# Patient Record
Sex: Male | Born: 1980
Health system: Southern US, Community
[De-identification: ages and names within clinical notes are randomized; demographics above are authoritative.]

## PROBLEM LIST (undated history)

## (undated) DIAGNOSIS — E663 Overweight: Secondary | ICD-10-CM

## (undated) DIAGNOSIS — K219 Gastro-esophageal reflux disease without esophagitis: Secondary | ICD-10-CM

## (undated) DIAGNOSIS — I499 Cardiac arrhythmia, unspecified: Secondary | ICD-10-CM

## (undated) DIAGNOSIS — G4733 Obstructive sleep apnea (adult) (pediatric): Secondary | ICD-10-CM

## (undated) DIAGNOSIS — L989 Disorder of the skin and subcutaneous tissue, unspecified: Secondary | ICD-10-CM

## (undated) DIAGNOSIS — Z800 Family history of malignant neoplasm of digestive organs: Secondary | ICD-10-CM

## (undated) DIAGNOSIS — E785 Hyperlipidemia, unspecified: Secondary | ICD-10-CM

## (undated) HISTORY — DX: Gastro-esophageal reflux disease without esophagitis: K21.9

## (undated) HISTORY — DX: Family history of malignant neoplasm of digestive organs: Z80.0

## (undated) HISTORY — DX: Hyperlipidemia, unspecified: E78.5

## (undated) HISTORY — PX: NO PAST SURGERIES: SHX2092

## (undated) HISTORY — DX: Disorder of the skin and subcutaneous tissue, unspecified: L98.9

## (undated) HISTORY — DX: Overweight: E66.3

## (undated) HISTORY — DX: Cardiac arrhythmia, unspecified: I49.9

## (undated) HISTORY — PX: TONSILLECTOMY: SUR1361

## (undated) HISTORY — DX: Morbid (severe) obesity due to excess calories: E66.01

## (undated) HISTORY — DX: Obstructive sleep apnea (adult) (pediatric): G47.33

---

## 2013-04-20 ENCOUNTER — Encounter: Payer: Self-pay | Admitting: Gastroenterology

## 2013-06-05 ENCOUNTER — Encounter: Payer: Self-pay | Admitting: *Deleted

## 2013-06-06 ENCOUNTER — Encounter: Payer: Self-pay | Admitting: Gastroenterology

## 2013-06-06 ENCOUNTER — Ambulatory Visit (INDEPENDENT_AMBULATORY_CARE_PROVIDER_SITE_OTHER): Payer: 59 | Admitting: Gastroenterology

## 2013-06-06 VITALS — BP 118/78 | HR 68 | Ht 73.0 in | Wt 251.0 lb

## 2013-06-06 DIAGNOSIS — Z800 Family history of malignant neoplasm of digestive organs: Secondary | ICD-10-CM

## 2013-06-06 DIAGNOSIS — K219 Gastro-esophageal reflux disease without esophagitis: Secondary | ICD-10-CM

## 2013-06-06 HISTORY — DX: Family history of malignant neoplasm of digestive organs: Z80.0

## 2013-06-06 NOTE — Progress Notes (Signed)
    _                                                                                                                History of Present Illness: Pleasant 33 year old white male referred for colorectal cancer screening.  His father developed colon cancer at 89 and died a year and a half later.  The patient has pyrosis which is well-controlled with Nexium.  Should he miss a dose he may develop a sour stomach and then frank pyrosis within 24 hours.  He denies dysphagia or abdominal pain.  There's been no change in bowel habits or rectal bleeding.    Past Medical History  Diagnosis Date  . Family hx of colon cancer     father  . GERD (gastroesophageal reflux disease)    History reviewed. No pertinent past surgical history. family history includes Colon cancer (age of onset: 75) in his father. Current Outpatient Prescriptions  Medication Sig Dispense Refill  . esomeprazole (NEXIUM) 40 MG capsule Take 40 mg by mouth daily at 12 noon.       No current facility-administered medications for this visit.   Allergies as of 06/06/2013  . (No Known Allergies)    reports that he has never smoked. He has never used smokeless tobacco. He reports that he does not drink alcohol or use illicit drugs.     Review of Systems: Pertinent positive and negative review of systems were noted in the above HPI section. All other review of systems were otherwise negative.  Vital signs were reviewed in today's medical record Physical Exam: General: Well developed , well nourished, no acute distress Skin: anicteric Head: Normocephalic and atraumatic Eyes:  sclerae anicteric, EOMI Ears: Normal auditory acuity Mouth: No deformity or lesions Neck: Supple, no masses or thyromegaly Lungs: Clear throughout to auscultation Heart: Regular rate and rhythm; no murmurs, rubs or bruits Abdomen: Soft, non tender and non distended. No masses, hepatosplenomegaly or hernias noted. Normal Bowel  sounds Rectal:deferred Musculoskeletal: Symmetrical with no gross deformities  Skin: No lesions on visible extremities Pulses:  Normal pulses noted Extremities: No clubbing, cyanosis, edema or deformities noted Neurological: Alert oriented x 4, grossly nonfocal Cervical Nodes:  No significant cervical adenopathy Inguinal Nodes: No significant inguinal adenopathy Psychological:  Alert and cooperative. Normal mood and affect  See Assessment and Plan under Problem List

## 2013-06-06 NOTE — Assessment & Plan Note (Signed)
Patient is asymptomatic.  I recommended beginning colorectal cancer screening at 7, sooner if he becomes anxious about this or if he develops any alarm symptoms including significant change in bowel habits or persistent rectal bleeding.

## 2013-06-06 NOTE — Patient Instructions (Signed)
Follow up as needed

## 2013-06-06 NOTE — Assessment & Plan Note (Signed)
Patient basically has PPI-dependent GERD.  He was advised to follow some antireflux measures and to try reducing Nexium to every other day, if possible.  Should he remain PPI-dependent then I would consider upper endoscopy at some point in the future to rule out Barrett's esophagus.

## 2018-11-08 DIAGNOSIS — R079 Chest pain, unspecified: Secondary | ICD-10-CM | POA: Diagnosis not present

## 2018-11-08 DIAGNOSIS — R002 Palpitations: Secondary | ICD-10-CM

## 2018-11-08 DIAGNOSIS — I4891 Unspecified atrial fibrillation: Secondary | ICD-10-CM | POA: Diagnosis not present

## 2018-11-08 DIAGNOSIS — R000 Tachycardia, unspecified: Secondary | ICD-10-CM

## 2018-11-08 DIAGNOSIS — E669 Obesity, unspecified: Secondary | ICD-10-CM

## 2018-11-09 DIAGNOSIS — I4891 Unspecified atrial fibrillation: Secondary | ICD-10-CM | POA: Diagnosis not present

## 2018-11-09 DIAGNOSIS — R002 Palpitations: Secondary | ICD-10-CM | POA: Diagnosis not present

## 2018-11-09 DIAGNOSIS — R000 Tachycardia, unspecified: Secondary | ICD-10-CM | POA: Diagnosis not present

## 2018-11-09 DIAGNOSIS — E669 Obesity, unspecified: Secondary | ICD-10-CM | POA: Diagnosis not present

## 2018-11-22 ENCOUNTER — Encounter: Payer: Self-pay | Admitting: Cardiology

## 2018-11-22 ENCOUNTER — Ambulatory Visit (INDEPENDENT_AMBULATORY_CARE_PROVIDER_SITE_OTHER): Payer: Commercial Managed Care - PPO | Admitting: Cardiology

## 2018-11-22 ENCOUNTER — Other Ambulatory Visit: Payer: Self-pay

## 2018-11-22 VITALS — BP 120/88 | HR 58 | Ht 73.0 in | Wt 273.0 lb

## 2018-11-22 DIAGNOSIS — I10 Essential (primary) hypertension: Secondary | ICD-10-CM | POA: Diagnosis not present

## 2018-11-22 DIAGNOSIS — R001 Bradycardia, unspecified: Secondary | ICD-10-CM

## 2018-11-22 DIAGNOSIS — I4891 Unspecified atrial fibrillation: Secondary | ICD-10-CM

## 2018-11-22 MED ORDER — METOPROLOL SUCCINATE ER 25 MG PO TB24: 12.5000 mg | ORAL_TABLET | Freq: Every day | ORAL | 3 refills | Status: DC

## 2018-11-22 NOTE — Patient Instructions (Addendum)
Medication Instructions:  Your physician has recommended you make the following change in your medication:   STOP aspirin  DECREASE metoprolol succinate (toprol-XL) 25 mg: Take 0.5 tablet (12.5 mg) daily   If you need a refill on your cardiac medications before your next appointment, please call your pharmacy.   Lab work: None  If you have labs (blood work) drawn today and your tests are completely normal, you will receive your results only by: [name] MyChart Message (if you have MyChart) OR . A paper copy in the mail If you have any lab test that is abnormal or we need to change your treatment, we will call you to review the results.  Testing/Procedures: You had an EKG today.   Follow-Up: At [facility], you and your health needs are our priority.  As part of our continuing mission to provide you with exceptional heart care, we have created designated Provider Care Teams.  These Care Teams include your primary Cardiologist (physician) and Advanced Practice Providers (APPs -  Physician Assistants and Nurse Practitioners) who all work together to provide you with the care you need, when you need it. You will need a follow up appointment in 3 months.  Please call our office 2 months in advance to schedule this appointment.

## 2018-11-22 NOTE — Progress Notes (Signed)
Cardiology Office Note:    Date:  11/22/2018   ID:  Alexander James, DOB 30-May-1980, MRN [id number]  PCP:  [name], MD  Cardiologist:  No primary care provider on file.  Electrophysiologist:  None   Referring MD: [name], MD   Chief Complaint  Patient presents with  . Atrial Fibrillation   History of Present Illness:    Alexander James is a 38 y.o. male with a hx of recently diagnosed atrial fibrillation, obesity, smokes but not every day he states, GERD who presents today for establishment of care.  The patient was admitted at the [facility] on September 29 after he presented with palpitations.  While being worked up in the ED the patient was found to be atrial fibrillation with rapid ventricular rate.  He was hypertensive at the time with a systolic blood pressure of 143/105 and was then started on Cardizem drip.  On rate control agents the patient converted to sinus rhythm.   He did have an echocardiogram while admitted which showed an LVEF of 50 to 55%.  With no valvular abnormalities.  Of note the patient tells me that this was his birthday weekend and he spend the weekend on the horse from riding and he had significant alcohol intake (he did show he drink more than 2 cases of beer).  Since his hospitalization he has been doing well he has not experienced any lightheadedness, dizziness, or any palpitations.  Past Medical History:  Diagnosis Date  . Family hx of colon cancer    father  . GERD (gastroesophageal reflux disease)     History reviewed. No pertinent surgical history.  Current Medications: Current Meds  Medication Sig  . diltiazem (CARDIZEM) 120 MG tablet Take 120 mg by mouth daily.  [name] esomeprazole (NEXIUM) 40 MG capsule Take 40 mg by mouth daily at 12 noon.  . metoprolol succinate (TOPROL-XL) 25 MG 24 hr tablet Take 0.5 tablets (12.5 mg total) by mouth daily.  . [DISCONTINUED] aspirin EC 81 MG tablet Take 81 mg by mouth daily.   . [DISCONTINUED] metoprolol succinate (TOPROL-XL) 25 MG 24 hr tablet Take 25 mg by mouth daily.     Allergies:   Patient has no known allergies.   Social History   Socioeconomic History  . Marital status: Married    Spouse name: Not on file  . Number of children: 2  . Years of education: Not on file  . Highest education level: Not on file  Occupational History  . Occupation: Scientist, research (physical sciences): [employer]  Social Needs  . Financial resource strain: Not on file  . Food insecurity    Worry: Not on file    Inability: Not on file  . Transportation needs    Medical: Not on file    Non-medical: Not on file  Tobacco Use  . Smoking status: Never Smoker  . Smokeless tobacco: Never Used  Substance and Sexual Activity  . Alcohol use: No  . Drug use: No  . Sexual activity: Not on file  Lifestyle  . Physical activity    Days per week: Not on file    Minutes per session: Not on file  . Stress: Not on file  Relationships  . Social Herbalist on phone: Not on file    Gets together: Not on file    Attends religious service: Not on file    Active member of club or organization: Not  on file    Attends meetings of clubs or organizations: Not on file    Relationship status: Not on file  Other Topics Concern  . Not on file  Social History Narrative  . Not on file     Family History: The patient's family history includes Colon cancer (age of onset: 66) in his father.  ROS:   Review of Systems  Constitution: Negative for decreased appetite, fever and weight gain.  HENT: Negative for congestion, ear discharge, hoarse voice and sore throat.   Eyes: Negative for discharge, redness, vision loss in right eye and visual halos.  Cardiovascular: Negative for chest pain, dyspnea on exertion, leg swelling, orthopnea and palpitations.  Respiratory: Negative for cough, hemoptysis, shortness of breath and snoring.   Endocrine: Negative for heat intolerance and  polyphagia.  Hematologic/Lymphatic: Negative for bleeding problem. Does not bruise/bleed easily.  Skin: Negative for flushing, nail changes, rash and suspicious lesions.  Musculoskeletal: Negative for arthritis, joint pain, muscle cramps, myalgias, neck pain and stiffness.  Gastrointestinal: Negative for abdominal pain, bowel incontinence, diarrhea and excessive appetite.  Genitourinary: Negative for decreased libido, genital sores and incomplete emptying.  Neurological: Negative for brief paralysis, focal weakness, headaches and loss of balance.  Psychiatric/Behavioral: Negative for altered mental status, depression and suicidal ideas.  Allergic/Immunologic: Negative for HIV exposure and persistent infections.    EKGs/Labs/Other Studies Reviewed:    The following studies were reviewed today:  EKG:  The ekg ordered today demonstrates sinus bradycardia, heart rate 58 bpm with nonspecific interventricular conduction defect.  Compared to his EKG done in [place] reports sinus rhythm heart rate 73 bpm.  No change.  Chest x-ray performed on November 08, 2018 reports no active disease Transthoracic echocardiogram performed on November 08, 2018 Patient was in atrial fibrillation.  Overall low normal left ventricular function visually estimated 50 to 55%.  The right ventricle is mildly enlarged.  The right ventricle had normal systolic function.  Left atrium was normal size.  The right atrium was mildly enlarged.  No valvular abnormalities. No pericardial effusion.  recent Labs: No results found for requested labs within last 8760 hours.  Recent Lipid Panel No results found for: CHOL, TRIG, HDL, CHOLHDL, VLDL, LDLCALC, LDLDIRECT  Physical Exam:    VS:  BP 120/88 (BP Location: Right Arm, Patient Position: Sitting, Cuff Size: Normal)   Pulse (!) 58   Ht 6\' 1"  (1.854 m)   Wt 273 lb (123.8 kg)   SpO2 98%   BMI 36.02 kg/m     Wt Readings from Last 3 Encounters:  11/22/18 273 lb (123.8 kg)   06/06/13 251 lb (113.9 kg)    GEN: Obese male, well nourished, well developed in no acute distress HEENT: Normal NECK: No JVD; No carotid bruits LYMPHATICS: No lymphadenopathy CARDIAC: S1S2 noted,RRR, no murmurs, rubs, gallops RESPIRATORY:  Clear to auscultation without rales, wheezing or rhonchi  ABDOMEN: Soft, non-tender, non-distended, +bowel sounds, no guarding. EXTREMITIES: No edema, No cyanosis, no clubbing MUSCULOSKELETAL:  No edema; No deformity  SKIN: Warm and dry NEUROLOGIC:  Alert and oriented x 3, non-focal PSYCHIATRIC:  Normal affect, good insight  ASSESSMENT:    1. Atrial fibrillation, unspecified type ([place])   2. Morbid obesity ([place])   3. Essential hypertension    PLAN:    1.  Patient with recently diagnosed atrial fibrillation which I do suspect holiday heart may have been playing a role.  Thankfully today the patient is in sinus rhythm, but his heart rate is 58 bpm.  Therefore at this time I will like to decrease his metoprolol succinate from 25 mg daily to 12.5 mg daily.  He remain on his diltiazem 120 mg daily.  In addition patient chads vas score is questionably.  He has been placed on aspirin 81 mg daily, for now we will stop the Aspirin 81 mg daily as the risk of bleeding especially in his line of with his line of work outweighs the minimum benefit of the aspirin 81 mg.  We will continue to reassess his need for anticoagulation for stroke prevention.  I explained this to patient and he is in agreement with this.   2.  With his new diagnosis of atrial fibrillation risk modification is also heavily weighted.  He is planning a sleep study with his PCP to rule out sleep apnea and if diagnosed will be treated with a CPAP.    3.  The patient understands the need to lose weight with diet and exercise. We have discussed specific strategies for this.  He also is working on decreasing alcohol intake especially on the weekends.  4.  His blood pressure is acceptable today we  will continue to monitor.  5. COVID-19 positive test (U07.1, COVID-19) with Acute Respiratory Distress Syndrome (ARDS) (J80, ARDS) (If respiratory failure or sepsis present, add as separate assessment)  Cholesterol panel and hemoglobin A1c has been reviewed.  His most recent hemoglobin A1c was 5.7.  TSH performed on September 29 was 1.44.  The patient is in agreement with the above plan. The patient left the office in stable condition.  The patient will follow up in 3 months or sooner if needed.  Medication Adjustments/Labs and Tests Ordered: Current medicines are reviewed at length with the patient today.  Concerns regarding medicines are outlined above.  Orders Placed This Encounter  Procedures  . EKG 12-Lead   Meds ordered this encounter  Medications  . metoprolol succinate (TOPROL-XL) 25 MG 24 hr tablet    Sig: Take 0.5 tablets (12.5 mg total) by mouth daily.    Dispense:  15 tablet    Refill:  3    Patient Instructions  Medication Instructions:  Your physician has recommended you make the following change in your medication:   STOP aspirin  DECREASE metoprolol succinate (toprol-XL) 25 mg: Take 0.5 tablet (12.5 mg) daily   If you need a refill on your cardiac medications before your next appointment, please call your pharmacy.   Lab work: None  If you have labs (blood work) drawn today and your tests are completely normal, you will receive your results only by: [name] MyChart Message (if you have MyChart) OR . A paper copy in the mail If you have any lab test that is abnormal or we need to change your treatment, we will call you to review the results.  Testing/Procedures: You had an EKG today.   Follow-Up: At [facility], you and your health needs are our priority.  As part of our continuing mission to provide you with exceptional heart care, we have created designated Provider Care Teams.  These Care Teams include your primary Cardiologist (physician) and Advanced  Practice Providers (APPs -  Physician Assistants and Nurse Practitioners) who all work together to provide you with the care you need, when you need it. You will need a follow up appointment in 3 months.  Please call our office 2 months in advance to schedule this appointment.     Reduce your risk of stroke if you have atrial fibrillation  Atrial fibrillation can lead to blood clots and stroke. Find out how to manage your risk. By [facility] Staff  Atrial fibrillation, a common heart rhythm disorder, can increase your risk of stroke. In atrial fibrillation, blood can pool in the heart's upper chambers and form blood clots. If a blood clot forms in the left-sided upper chamber (left atrium), it could break free from your heart and travel to your brain. A blood clot can block blood flow to your brain and cause a stroke. Blood clots can also block blood flow to other organs. The risk of stroke from atrial fibrillation rises as you grow older. The following health conditions also increase your risk: . High blood pressure  . Diabetes  . Heart failure  . Some valvular heart disease  . Stroke To reduce your risk of stroke or damage to other organs caused by blood clots, your doctor may prescribe a blood-thinning medication (anticoagulant), such as: . Apixaban (Eliquis)  . Dabigatran (Pradaxa)  . Edoxaban (Savaysa)  . Rivaroxaban (Xarelto)  . Warfarin (Coumadin, Jantoven)  Sometimes, if you can't take blood-thinning medications, your doctor may recommend a procedure called left atrial appendage closure to seal a small sac (appendage) in your left atrium, where most clots form in people with atrial fibrillation. A closure device is gently guided through a catheter to the appendage. Once the device is in place, the catheter is removed. The device is left permanently in place. Surgery to close the left atrial appendage is an option for some people already having heart surgery. Living a healthy lifestyle  can improve your heart health and reduce your risk of developing atrial fibrillation. Aim to include healthy habits in your life such as: [name] Exercising regularly  . Eating heart-healthy foods  . Keeping a healthy weight  . Avoiding smoking  . Limiting or avoiding alcohol  . Keeping blood pressure and cholesterol levels under control Managing your atrial fibrillation and any other conditions you have that increase your risk of stroke also can help you reduce your risk of stroke.Adopting a Healthy Lifestyle.  Know what a healthy weight is for you (roughly BMI <25) and aim to maintain this   Aim for 7+ servings of fruits and vegetables daily   65-80+ fluid ounces of water or unsweet tea for healthy kidneys   Limit to max 1 drink of alcohol per day; avoid smoking/tobacco   Limit animal fats in diet for cholesterol and heart health - choose grass fed whenever available   Avoid highly processed foods, and foods high in saturated/trans fats   Aim for low stress - take time to unwind and care for your mental health   Aim for 150 min of moderate intensity exercise weekly for heart health, and weights twice weekly for bone health   Aim for 7-9 hours of sleep daily   When it comes to diets, agreement about the perfect plan isnt easy to find, even among the experts. Experts at the [place] developed an idea known as the Healthy Eating Plate. Just imagine a plate divided into logical, healthy portions.   The emphasis is on diet quality:   Load up on vegetables and fruits - one-half of your plate: Aim for color and variety, and remember that potatoes dont count.   Go for whole grains - one-quarter of your plate: Whole wheat, barley, wheat berries, quinoa, oats, brown rice, and foods made with them. If you want pasta, go with whole wheat pasta.  Protein power - one-quarter of your plate: Fish, chicken, beans, and nuts are all healthy, versatile protein sources. Limit red  meat.   The diet, however, does go beyond the plate, offering a few other suggestions.   Use healthy plant oils, such as olive, canola, soy, corn, sunflower and peanut. Check the labels, and avoid partially hydrogenated oil, which have unhealthy trans fats.   If youre thirsty, drink water. Coffee and tea are good in moderation, but skip sugary drinks and limit milk and dairy products to one or two daily servings.   The type of carbohydrate in the diet is more important than the amount. Some sources of carbohydrates, such as vegetables, fruits, whole grains, and beans-are healthier than others.   Finally, stay active  Signed, Berniece Salines, DO  11/22/2018 9:36 AM    [place]

## 2019-03-03 ENCOUNTER — Other Ambulatory Visit (INDEPENDENT_AMBULATORY_CARE_PROVIDER_SITE_OTHER): Payer: Commercial Managed Care - PPO

## 2019-03-03 ENCOUNTER — Encounter: Payer: Self-pay | Admitting: Cardiology

## 2019-03-03 ENCOUNTER — Other Ambulatory Visit: Payer: Self-pay

## 2019-03-03 ENCOUNTER — Ambulatory Visit (INDEPENDENT_AMBULATORY_CARE_PROVIDER_SITE_OTHER): Payer: Commercial Managed Care - PPO | Admitting: Cardiology

## 2019-03-03 VITALS — BP 144/82 | HR 79 | Ht 73.0 in | Wt 280.0 lb

## 2019-03-03 DIAGNOSIS — I480 Paroxysmal atrial fibrillation: Secondary | ICD-10-CM | POA: Diagnosis not present

## 2019-03-03 NOTE — Addendum Note (Signed)
Addended by: Ronaldo Miyamoto on: 03/03/2019 09:17 AM   Modules accepted: Orders

## 2019-03-03 NOTE — Progress Notes (Signed)
Cardiology Office Note:    Date:  03/03/2019   ID:  Alexander James, DOB Oct 03, 1980, MRN [id number]  PCP:  [name], MD  Cardiologist:  Berniece Salines, DO  Electrophysiologist:  None   Referring MD: [name], MD   Follow-up for atrial fibrillation  History of Present Illness:    Alexander James is a 39 y.o. male with a hx of atrial fibrillation, GERD is here today for a follow up visit. He reports that at time he thinks for few seconds he is in atrial fibrillation.  He described as quick palpitations.  He denies any chest pain, shortness of breath, nausea, vomiting he tells me does not last long enough for him to be able to go to the ED.  No other complaints at this time.  Past Medical History:  Diagnosis Date  . Family hx of colon cancer    father  . GERD (gastroesophageal reflux disease)     History reviewed. No pertinent surgical history.  Current Medications: Current Meds  Medication Sig  . diltiazem (CARDIZEM) 120 MG tablet Take 120 mg by mouth daily.  [name] esomeprazole (NEXIUM) 40 MG capsule Take 40 mg by mouth daily at 12 noon.  . metoprolol succinate (TOPROL-XL) 25 MG 24 hr tablet Take 0.5 tablets (12.5 mg total) by mouth daily.     Allergies:   Patient has no known allergies.   Social History   Socioeconomic History  . Marital status: Married    Spouse name: Not on file  . Number of children: 2  . Years of education: Not on file  . Highest education level: Not on file  Occupational History  . Occupation: Scientist, research (physical sciences): [employer]  Tobacco Use  . Smoking status: Never Smoker  . Smokeless tobacco: Never Used  Substance and Sexual Activity  . Alcohol use: No  . Drug use: No  . Sexual activity: Not on file  Other Topics Concern  . Not on file  Social History Narrative  . Not on file   Social Determinants of Health   Financial Resource Strain:   . Difficulty of Paying Living Expenses: Not on file  Food Insecurity:    . Worried About Charity fundraiser in the Last Year: Not on file  . Ran Out of Food in the Last Year: Not on file  Transportation Needs:   . Lack of Transportation (Medical): Not on file  . Lack of Transportation (Non-Medical): Not on file  Physical Activity:   . Days of Exercise per Week: Not on file  . Minutes of Exercise per Session: Not on file  Stress:   . Feeling of Stress : Not on file  Social Connections:   . Frequency of Communication with Friends and Family: Not on file  . Frequency of Social Gatherings with Friends and Family: Not on file  . Attends Religious Services: Not on file  . Active Member of Clubs or Organizations: Not on file  . Attends Archivist Meetings: Not on file  . Marital Status: Not on file     Family History: The patient's family history includes Colon cancer (age of onset: 45) in his father.  ROS:   Review of Systems  Constitution: Negative for decreased appetite, fever and weight gain.  HENT: Negative for congestion, ear discharge, hoarse voice and sore throat.   Eyes: Negative for discharge, redness, vision loss in right eye and visual halos.  Cardiovascular: Negative  for chest pain, dyspnea on exertion, leg swelling, orthopnea and palpitations.  Respiratory: Negative for cough, hemoptysis, shortness of breath and snoring.   Endocrine: Negative for heat intolerance and polyphagia.  Hematologic/Lymphatic: Negative for bleeding problem. Does not bruise/bleed easily.  Skin: Negative for flushing, nail changes, rash and suspicious lesions.  Musculoskeletal: Negative for arthritis, joint pain, muscle cramps, myalgias, neck pain and stiffness.  Gastrointestinal: Negative for abdominal pain, bowel incontinence, diarrhea and excessive appetite.  Genitourinary: Negative for decreased libido, genital sores and incomplete emptying.  Neurological: Negative for brief paralysis, focal weakness, headaches and loss of balance.    Psychiatric/Behavioral: Negative for altered mental status, depression and suicidal ideas.  Allergic/Immunologic: Negative for HIV exposure and persistent infections.    EKGs/Labs/Other Studies Reviewed:    The following studies were reviewed today:   EKG:  The ekg ordered today demonstrates sinus rhythm, heart rate 70 bpm compared to prior EKG patient no longer sinus bradycardia.   Chest x-ray performed on November 08, 2018 reports no active disease Transthoracic echocardiogram performed on November 08, 2018 Patient was in atrial fibrillation.  Overall low normal left ventricular function visually estimated 50 to 55%.  The right ventricle is mildly enlarged.  The right ventricle had normal systolic function.  Left atrium was normal size.  The right atrium was mildly enlarged.  No valvular abnormalities. No pericardial effusion.  Recent Labs: No results found for requested labs within last 8760 hours.  Recent Lipid Panel No results found for: CHOL, TRIG, HDL, CHOLHDL, VLDL, LDLCALC, LDLDIRECT  Physical Exam:    VS:  BP (!) 144/82   Pulse 79   Ht 6\' 1"  (1.854 m)   Wt 280 lb (127 kg)   SpO2 93%   BMI 36.94 kg/m     Wt Readings from Last 3 Encounters:  03/03/19 280 lb (127 kg)  11/22/18 273 lb (123.8 kg)  06/06/13 251 lb (113.9 kg)     GEN: Well nourished, well developed in no acute distress HEENT: Normal NECK: No JVD; No carotid bruits LYMPHATICS: No lymphadenopathy CARDIAC: S1S2 noted,RRR, no murmurs, rubs, gallops RESPIRATORY:  Clear to auscultation without rales, wheezing or rhonchi  ABDOMEN: Soft, non-tender, non-distended, +bowel sounds, no guarding. EXTREMITIES: No edema, No cyanosis, no clubbing MUSCULOSKELETAL:  No edema; No deformity  SKIN: Warm and dry NEUROLOGIC:  Alert and oriented x 3, non-focal PSYCHIATRIC:  Normal affect, good insight  ASSESSMENT:    1. Paroxysmal A-fib (HCC)    PLAN:    1.  He is in sinus rhythm today.  The patient suspect  that he is going in and out of atrial fibrillation.  His first episode was suspected to be likely all of the heart giving the circumstances that he had had heavy drinking for his birthday prior to getting into atrial fibrillation.  However given the concern that he is having palpitations I am going to place a ZIO monitor on the patient for 14 days to assess for any heart rate excursions.  His CHA2DS2-VASc score remains to be 1 for hypertension-therefore no indication for anticoagulation.  We will continue to reassess the need for anticoagulation.  If he is having burst of atrial fibrillation he will be a good candidate for flecainide 50 mg twice daily.  2.  He is hypertensive in the office today 144/82 mmHg.  He meets the eating high salt food in the last few days.  I have advised him to decrease his salt intake.  The patient is in agreement with the  above plan. The patient left the office in stable condition.  The patient will follow up in 3 months or sooner if needed.   Medication Adjustments/Labs and Tests Ordered: Current medicines are reviewed at length with the patient today.  Concerns regarding medicines are outlined above.  Orders Placed This Encounter  Procedures  . LONG TERM MONITOR (3-14 DAYS)   No orders of the defined types were placed in this encounter.   Patient Instructions  Medication Instructions:  Your physician recommends that you continue on your current medications as directed. Please refer to the Current Medication list given to you today.  *If you need a refill on your cardiac medications before your next appointment, please call your pharmacy*  Lab Work: None ordered   If you have labs (blood work) drawn today and your tests are completely normal, you will receive your results only by: [name] MyChart Message (if you have MyChart) OR . A paper copy in the mail If you have any lab test that is abnormal or we need to change your treatment, we will call you to review the  results.  Testing/Procedures: A zio monitor was ordered today. It will remain on for 14 days. You will then return monitor and event diary in provided box. It takes 1-2 weeks for report to be downloaded and returned to [place]. We will call you with the results. If monitor falls off or has orange flashing light, please call Zio for further instructions.     Follow-Up: At [facility], you and your health needs are our priority.  As part of our continuing mission to provide you with exceptional heart care, we have created designated Provider Care Teams.  These Care Teams include your primary Cardiologist (physician) and Advanced Practice Providers (APPs -  Physician Assistants and Nurse Practitioners) who all work together to provide you with the care you need, when you need it.  Your next appointment:   3 month(s)  The format for your next appointment:   In Person  Provider:   Shirlee More, MD  Other Instructions None      Adopting a Healthy Lifestyle.  Know what a healthy weight is for you (roughly BMI <25) and aim to maintain this   Aim for 7+ servings of fruits and vegetables daily   65-80+ fluid ounces of water or unsweet tea for healthy kidneys   Limit to max 1 drink of alcohol per day; avoid smoking/tobacco   Limit animal fats in diet for cholesterol and heart health - choose grass fed whenever available   Avoid highly processed foods, and foods high in saturated/trans fats   Aim for low stress - take time to unwind and care for your mental health   Aim for 150 min of moderate intensity exercise weekly for heart health, and weights twice weekly for bone health   Aim for 7-9 hours of sleep daily   When it comes to diets, agreement about the perfect plan isnt easy to find, even among the experts. Experts at the [place] developed an idea known as the Healthy Eating Plate. Just imagine a plate divided into logical, healthy portions.   The  emphasis is on diet quality:   Load up on vegetables and fruits - one-half of your plate: Aim for color and variety, and remember that potatoes dont count.   Go for whole grains - one-quarter of your plate: Whole wheat, barley, wheat berries, quinoa, oats, brown rice, and foods made with them.  If you want pasta, go with whole wheat pasta.   Protein power - one-quarter of your plate: Fish, chicken, beans, and nuts are all healthy, versatile protein sources. Limit red meat.   The diet, however, does go beyond the plate, offering a few other suggestions.   Use healthy plant oils, such as olive, canola, soy, corn, sunflower and peanut. Check the labels, and avoid partially hydrogenated oil, which have unhealthy trans fats.   If youre thirsty, drink water. Coffee and tea are good in moderation, but skip sugary drinks and limit milk and dairy products to one or two daily servings.   The type of carbohydrate in the diet is more important than the amount. Some sources of carbohydrates, such as vegetables, fruits, whole grains, and beans-are healthier than others.   Finally, stay active  Signed, Berniece Salines, DO  03/03/2019 8:55 AM    [place]

## 2019-03-03 NOTE — Patient Instructions (Signed)
Medication Instructions:  Your physician recommends that you continue on your current medications as directed. Please refer to the Current Medication list given to you today.  *If you need a refill on your cardiac medications before your next appointment, please call your pharmacy*  Lab Work: None ordered   If you have labs (blood work) drawn today and your tests are completely normal, you will receive your results only by: [name] MyChart Message (if you have MyChart) OR . A paper copy in the mail If you have any lab test that is abnormal or we need to change your treatment, we will call you to review the results.  Testing/Procedures: A zio monitor was ordered today. It will remain on for 14 days. You will then return monitor and event diary in provided box. It takes 1-2 weeks for report to be downloaded and returned to [place]. We will call you with the results. If monitor falls off or has orange flashing light, please call Zio for further instructions.     Follow-Up: At [facility], you and your health needs are our priority.  As part of our continuing mission to provide you with exceptional heart care, we have created designated Provider Care Teams.  These Care Teams include your primary Cardiologist (physician) and Advanced Practice Providers (APPs -  Physician Assistants and Nurse Practitioners) who all work together to provide you with the care you need, when you need it.  Your next appointment:   3 month(s)  The format for your next appointment:   In Person  Provider:   Shirlee More, MD  Other Instructions None

## 2019-03-06 NOTE — Addendum Note (Signed)
Addended by: Particia Nearing B on: 03/06/2019 09:42 AM   Modules accepted: Orders

## 2019-03-30 ENCOUNTER — Telehealth: Payer: Self-pay | Admitting: *Deleted

## 2019-03-30 NOTE — Telephone Encounter (Signed)
-----   Message from Berniece Salines, DO sent at 03/29/2019 11:17 PM EST ----- Please see if the patient can see me in the next 2 weeks to discuss his monitor results.

## 2019-03-30 NOTE — Telephone Encounter (Signed)
Patient informed and verbalized understanding. Copy sent to PCP 

## 2019-04-18 ENCOUNTER — Other Ambulatory Visit: Payer: Self-pay | Admitting: Cardiology

## 2019-04-24 ENCOUNTER — Encounter: Payer: Self-pay | Admitting: Cardiology

## 2019-04-24 ENCOUNTER — Ambulatory Visit: Payer: Commercial Managed Care - PPO | Admitting: Cardiology

## 2019-04-24 ENCOUNTER — Other Ambulatory Visit: Payer: Self-pay

## 2019-04-24 VITALS — BP 140/86 | HR 82 | Ht 73.0 in | Wt 279.0 lb

## 2019-04-24 DIAGNOSIS — I480 Paroxysmal atrial fibrillation: Secondary | ICD-10-CM

## 2019-04-24 DIAGNOSIS — E669 Obesity, unspecified: Secondary | ICD-10-CM

## 2019-04-24 DIAGNOSIS — I10 Essential (primary) hypertension: Secondary | ICD-10-CM | POA: Diagnosis not present

## 2019-04-24 NOTE — Patient Instructions (Addendum)
Medication Instructions:  Your physician recommends that you continue on your current medications as directed. Please refer to the Current Medication list given to you today.  *If you need a refill on your cardiac medications before your next appointment, please call your pharmacy*   Lab Work: None If you have labs (blood work) drawn today and your tests are completely normal, you will receive your results only by: [name] MyChart Message (if you have MyChart) OR . A paper copy in the mail If you have any lab test that is abnormal or we need to change your treatment, we will call you to review the results.   Testing/Procedures: none   Follow-Up: At [facility], you and your health needs are our priority.  As part of our continuing mission to provide you with exceptional heart care, we have created designated Provider Care Teams.  These Care Teams include your primary Cardiologist (physician) and Advanced Practice Providers (APPs -  Physician Assistants and Nurse Practitioners) who all work together to provide you with the care you need, when you need it.  We recommend signing up for the patient portal called "MyChart".  Sign up information is provided on this After Visit Summary.  MyChart is used to connect with patients for Virtual Visits (Telemedicine).  Patients are able to view lab/test results, encounter notes, upcoming appointments, etc.  Non-urgent messages can be sent to your provider as well.   To learn more about what you can do with MyChart, go to [email or web address].    Your next appointment:   3 month(s)  The format for your next appointment:   In Person  Provider:   Berniece Salines, DO   Other Instructions: [email or web address] [email or web address]        FDA-cleared, clinical grade mobile EKG monitor: Jodelle Red is the most clinically-validated mobile EKG used by the world's leading cardiac care medical professionals With Basic service, know  instantly if your heart rhythm is normal or if atrial fibrillation is detected, and email the last single EKG recording to yourself or your doctor Premium service, available for purchase through the Kardia app for $9.99 per month or $99 per year, includes unlimited history and storage of your EKG recordings, a monthly EKG summary report to share with your doctor, along with the ability to track your blood pressure, activity and weight Includes one KardiaMobile phone clip FREE SHIPPING: Standard delivery 1-3 business days. Orders placed by 11:00am PST will ship that afternoon. Otherwise, will ship next business day. All orders ship via [employer] from [place], [place]

## 2019-04-24 NOTE — Progress Notes (Signed)
Cardiology Office Note:    Date:  04/24/2019   ID:  Alexander James, DOB 1981-01-12, MRN [id number]  PCP:  [name], MD  Cardiologist:  Berniece Salines, DO  Electrophysiologist:  None   Referring MD: [name], MD   Chief Complaint  Patient presents with  . Follow-up   History of Present Illness:    Alexander James is a 39 y.o. male with a hx of atrial fibrillation, GERD is here today for a follow up visit. He tells me that he has felt better since his last visit.  He offers no other complaints at this time.   Past Medical History:  Diagnosis Date  . Family hx of colon cancer    father  . GERD (gastroesophageal reflux disease)     No past surgical history on file.  Current Medications: Current Meds  Medication Sig  . diltiazem (CARDIZEM) 120 MG tablet Take 120 mg by mouth daily.  [name] esomeprazole (NEXIUM) 40 MG capsule Take 40 mg by mouth daily at 12 noon.  . metoprolol succinate (TOPROL-XL) 25 MG 24 hr tablet TAKE 1/2 TABLET EVERY DAY     Allergies:   Patient has no known allergies.   Social History   Socioeconomic History  . Marital status: Married    Spouse name: Not on file  . Number of children: 2  . Years of education: Not on file  . Highest education level: Not on file  Occupational History  . Occupation: Scientist, research (physical sciences): [employer]  Tobacco Use  . Smoking status: Never Smoker  . Smokeless tobacco: Never Used  Substance and Sexual Activity  . Alcohol use: No  . Drug use: No  . Sexual activity: Not on file  Other Topics Concern  . Not on file  Social History Narrative  . Not on file   Social Determinants of Health   Financial Resource Strain:   . Difficulty of Paying Living Expenses:   Food Insecurity:   . Worried About Charity fundraiser in the Last Year:   . Arboriculturist in the Last Year:   Transportation Needs:   . Film/video editor (Medical):   [name] Lack of Transportation (Non-Medical):     Physical Activity:   . Days of Exercise per Week:   . Minutes of Exercise per Session:   Stress:   . Feeling of Stress :   Social Connections:   . Frequency of Communication with Friends and Family:   . Frequency of Social Gatherings with Friends and Family:   . Attends Religious Services:   . Active Member of Clubs or Organizations:   . Attends Archivist Meetings:   [name] Marital Status:      Family History: The patient's family history includes Colon cancer (age of onset: 25) in his father.  ROS:   Review of Systems  Constitution: Negative for decreased appetite, fever and weight gain.  HENT: Negative for congestion, ear discharge, hoarse voice and sore throat.   Eyes: Negative for discharge, redness, vision loss in right eye and visual halos.  Cardiovascular: Negative for chest pain, dyspnea on exertion, leg swelling, orthopnea and palpitations.  Respiratory: Negative for cough, hemoptysis, shortness of breath and snoring.   Endocrine: Negative for heat intolerance and polyphagia.  Hematologic/Lymphatic: Negative for bleeding problem. Does not bruise/bleed easily.  Skin: Negative for flushing, nail changes, rash and suspicious lesions.  Musculoskeletal: Negative for arthritis, joint pain, muscle  cramps, myalgias, neck pain and stiffness.  Gastrointestinal: Negative for abdominal pain, bowel incontinence, diarrhea and excessive appetite.  Genitourinary: Negative for decreased libido, genital sores and incomplete emptying.  Neurological: Negative for brief paralysis, focal weakness, headaches and loss of balance.  Psychiatric/Behavioral: Negative for altered mental status, depression and suicidal ideas.  Allergic/Immunologic: Negative for HIV exposure and persistent infections.    EKGs/Labs/Other Studies Reviewed:    The following studies were reviewed today:   EKG:  The ekg ordered today demonstrates sinus rhythm, 66 bpm.  Prior EKG no significant change.  Zio  monitor  The patient wore the monitor for 7 days 11 hours starting March 03, 2019. Indication: Paroxysmal atrial fibrillation.  The minimum heart rate was 48 bpm, maximum heart rate was 156 bpm, and average heart rate was 78 bpm. Predominant underlying rhythm was Sinus Rhythm.  First-degree AV block was present.  2 Supraventricular Tachycardia (which appeared to be short runs of atrial fibrillation)  occurred, the run with the fastest interval lasting 5 beats with a max rate of 156 bpm, the longest lasting 8 beats with an avg rate of 121 bpm.  Premature atrial complexes were rare (less than 1%). Premature Ventricular complexes were rare (less than 1%). No ventricular tachycardia, no pauses, No second or third-degree AV block .  2 patient triggered events noted 1 associated with paroxysmal SVT and the other with sinus rhythm.  Conclusion: This study is symptomatic paroxysmal SVT with appears to be short runs of atrial fibrillation.  Recent Labs: No results found for requested labs within last 8760 hours.  Recent Lipid Panel No results found for: CHOL, TRIG, HDL, CHOLHDL, VLDL, LDLCALC, LDLDIRECT  Physical Exam:    VS:  BP 140/86 (BP Location: Left Arm, Patient Position: Sitting, Cuff Size: Large)   Pulse 82   Ht 6\' 1"  (1.854 m)   Wt 279 lb (126.6 kg)   SpO2 98%   BMI 36.81 kg/m     Wt Readings from Last 3 Encounters:  04/24/19 279 lb (126.6 kg)  03/03/19 280 lb (127 kg)  11/22/18 273 lb (123.8 kg)     GEN: Well nourished, well developed in no acute distress HEENT: Normal NECK: No JVD; No carotid bruits LYMPHATICS: No lymphadenopathy CARDIAC: S1S2 noted,RRR, no murmurs, rubs, gallops RESPIRATORY:  Clear to auscultation without rales, wheezing or rhonchi  ABDOMEN: Soft, non-tender, non-distended, +bowel sounds, no guarding. EXTREMITIES: No edema, No cyanosis, no clubbing MUSCULOSKELETAL:  No deformity  SKIN: Warm and dry NEUROLOGIC:  Alert and oriented x 3,  non-focal PSYCHIATRIC:  Normal affect, good insight  ASSESSMENT:    1. Paroxysmal A-fib ([place])   2. Essential hypertension   3. Obesity (BMI 30-39.9)    PLAN:     He is on Cardizem 120 mg daily and Toprol Xl 12.5 mg daily. He tells me that he has felt better. I discussed his monitor results with him. I preferred to start antiarrhythmics-flecainide 50 mg the patient prefers to hold off for now since he feels better - he is in SR today.  So I asked him to monitor his rate and rhythm at home, I did advise him to get a Evalee Mutton which will help with home monitoring. At his next visit we will review this and further recommendations will be made.   HTN - discussed with patient to decrease salt intake in his diet- keep same regimen for now.  Obesity -the patient understands the need to lose weight with diet and exercise. We have discussed  specific strategies for this.  The patient is in agreement with the above plan. The patient left the office in stable condition.  The patient will follow up in 3 months.   Medication Adjustments/Labs and Tests Ordered: Current medicines are reviewed at length with the patient today.  Concerns regarding medicines are outlined above.  Orders Placed This Encounter  Procedures  . EKG 12-Lead   No orders of the defined types were placed in this encounter.   Patient Instructions  Medication Instructions:  Your physician recommends that you continue on your current medications as directed. Please refer to the Current Medication list given to you today.  *If you need a refill on your cardiac medications before your next appointment, please call your pharmacy*   Lab Work: None If you have labs (blood work) drawn today and your tests are completely normal, you will receive your results only by: [name] MyChart Message (if you have MyChart) OR . A paper copy in the mail If you have any lab test that is abnormal or we need to change your treatment, we will call  you to review the results.   Testing/Procedures: none   Follow-Up: At [facility], you and your health needs are our priority.  As part of our continuing mission to provide you with exceptional heart care, we have created designated Provider Care Teams.  These Care Teams include your primary Cardiologist (physician) and Advanced Practice Providers (APPs -  Physician Assistants and Nurse Practitioners) who all work together to provide you with the care you need, when you need it.  We recommend signing up for the patient portal called "MyChart".  Sign up information is provided on this After Visit Summary.  MyChart is used to connect with patients for Virtual Visits (Telemedicine).  Patients are able to view lab/test results, encounter notes, upcoming appointments, etc.  Non-urgent messages can be sent to your provider as well.   To learn more about what you can do with MyChart, go to [email or web address].    Your next appointment:   3 month(s)  The format for your next appointment:   In Person  Provider:   Berniece Salines, DO   Other Instructions: [email or web address] [email or web address]        FDA-cleared, clinical grade mobile EKG monitor: Jodelle Red is the most clinically-validated mobile EKG used by the world's leading cardiac care medical professionals With Basic service, know instantly if your heart rhythm is normal or if atrial fibrillation is detected, and email the last single EKG recording to yourself or your doctor Premium service, available for purchase through the Kardia app for $9.99 per month or $99 per year, includes unlimited history and storage of your EKG recordings, a monthly EKG summary report to share with your doctor, along with the ability to track your blood pressure, activity and weight Includes one KardiaMobile phone clip FREE SHIPPING: Standard delivery 1-3 business days. Orders placed by 11:00am PST will ship that afternoon. Otherwise,  will ship next business day. All orders ship via [employer] from [place], [place]         Adopting a Healthy Lifestyle.  Know what a healthy weight is for you (roughly BMI <25) and aim to maintain this   Aim for 7+ servings of fruits and vegetables daily   65-80+ fluid ounces of water or unsweet tea for healthy kidneys   Limit to max 1 drink of alcohol per day; avoid smoking/tobacco   Limit animal fats in diet for cholesterol and heart health -  choose grass fed whenever available   Avoid highly processed foods, and foods high in saturated/trans fats   Aim for low stress - take time to unwind and care for your mental health   Aim for 150 min of moderate intensity exercise weekly for heart health, and weights twice weekly for bone health   Aim for 7-9 hours of sleep daily   When it comes to diets, agreement about the perfect plan isnt easy to find, even among the experts. Experts at the [employer] developed an idea known as the Healthy Eating Plate. Just imagine a plate divided into logical, healthy portions.   The emphasis is on diet quality:   Load up on vegetables and fruits - one-half of your plate: Aim for color and variety, and remember that potatoes dont count.   Go for whole grains - one-quarter of your plate: Whole wheat, barley, wheat berries, quinoa, oats, brown rice, and foods made with them. If you want pasta, go with whole wheat pasta.   Protein power - one-quarter of your plate: Fish, chicken, beans, and nuts are all healthy, versatile protein sources. Limit red meat.   The diet, however, does go beyond the plate, offering a few other suggestions.   Use healthy plant oils, such as olive, canola, soy, corn, sunflower and peanut. Check the labels, and avoid partially hydrogenated oil, which have unhealthy trans fats.   If youre thirsty, drink water. Coffee and tea are good in moderation, but skip sugary drinks and limit milk and dairy  products to one or two daily servings.   The type of carbohydrate in the diet is more important than the amount. Some sources of carbohydrates, such as vegetables, fruits, whole grains, and beans-are healthier than others.   Finally, stay active  Signed, Berniece Salines, DO  04/24/2019 9:23 AM    [facility]

## 2019-05-31 ENCOUNTER — Ambulatory Visit: Payer: Commercial Managed Care - PPO | Admitting: Cardiology

## 2019-06-02 ENCOUNTER — Ambulatory Visit: Payer: Commercial Managed Care - PPO | Admitting: Cardiology

## 2019-06-06 ENCOUNTER — Ambulatory Visit: Payer: Commercial Managed Care - PPO | Admitting: Cardiology

## 2019-06-09 ENCOUNTER — Encounter: Payer: Self-pay | Admitting: Cardiology

## 2019-06-09 ENCOUNTER — Other Ambulatory Visit: Payer: Self-pay

## 2019-06-09 ENCOUNTER — Ambulatory Visit: Payer: Commercial Managed Care - PPO | Admitting: Cardiology

## 2019-06-09 VITALS — BP 138/90 | HR 70 | Ht 72.0 in | Wt 269.0 lb

## 2019-06-09 DIAGNOSIS — I480 Paroxysmal atrial fibrillation: Secondary | ICD-10-CM | POA: Insufficient documentation

## 2019-06-09 DIAGNOSIS — I10 Essential (primary) hypertension: Secondary | ICD-10-CM

## 2019-06-09 DIAGNOSIS — E669 Obesity, unspecified: Secondary | ICD-10-CM

## 2019-06-09 HISTORY — DX: Essential (primary) hypertension: I10

## 2019-06-09 HISTORY — DX: Paroxysmal atrial fibrillation: I48.0

## 2019-06-09 MED ORDER — FLECAINIDE ACETATE 50 MG PO TABS: 50.0000 mg | ORAL_TABLET | Freq: Two times a day (BID) | ORAL | 1 refills | Status: DC

## 2019-06-09 MED ORDER — DILTIAZEM HCL ER COATED BEADS 240 MG PO CP24: 240.0000 mg | ORAL_CAPSULE | Freq: Every day | ORAL | 1 refills | Status: DC

## 2019-06-09 NOTE — Patient Instructions (Signed)
Medication Instructions:  Your physician has recommended you make the following change in your medication:   STOP:Metoprolol   START: Flecainide 50 mg twice daily   INCREASE: Cardizem to 240 mg daily   *If you need a refill on your cardiac medications before your next appointment, please call your pharmacy*   Lab Work: None.  If you have labs (blood work) drawn today and your tests are completely normal, you will receive your results only by: [name] MyChart Message (if you have MyChart) OR . A paper copy in the mail If you have any lab test that is abnormal or we need to change your treatment, we will call you to review the results.   Testing/Procedures: None.    Follow-Up: At [facility], you and your health needs are our priority.  As part of our continuing mission to provide you with exceptional heart care, we have created designated Provider Care Teams.  These Care Teams include your primary Cardiologist (physician) and Advanced Practice Providers (APPs -  Physician Assistants and Nurse Practitioners) who all work together to provide you with the care you need, when you need it.  We recommend signing up for the patient portal called "MyChart".  Sign up information is provided on this After Visit Summary.  MyChart is used to connect with patients for Virtual Visits (Telemedicine).  Patients are able to view lab/test results, encounter notes, upcoming appointments, etc.  Non-urgent messages can be sent to your provider as well.   To learn more about what you can do with MyChart, go to [email or web address].    Your next appointment:   3 month(s)  The format for your next appointment:   In Person  Provider:   Berniece Salines, DO   Other Instructions  You will need a ekg visit next Friday.

## 2019-06-09 NOTE — Progress Notes (Signed)
Cardiology Office Note:    Date:  06/09/2019   ID:  Alexander James, DOB December 10, 1980, MRN [id number]  PCP:  [name], MD  Cardiologist:  Berniece Salines, DO  Electrophysiologist:  None   Referring MD: [name], MD   Chief Complaint  Patient presents with  . Follow-up    3 Months   History of Present Illness:    Alexander James is a 39 y.o. male with a hx of paroxysmal atrial fibrillation, GERD, hypertension is here today for follow-up visit.  I last saw him on April 24, 2019 at that time we discussed his ZIO monitor which show some runs of paroxysmal atrial fibrillation.  We discussed the start of flecainide at that time the patient prefers to hold off.  The patient is here for follow-up visit.  He tells me that he has had episodes where he felt that he was definitely in atrial fibrillation.  He has iPad watch which he notes the heart rate change in the 100s when he feels like he is out of rhythm.  He notes that it has been particularly stressful for him recently.  No other complaints at this time.  He denies shortness of breath, chest pain  Past Medical History:  Diagnosis Date  . Esophageal reflux 06/06/2013  . Family history of malignant neoplasm of gastrointestinal tract 06/06/2013   Father with colon cancer age 64   . Family hx of colon cancer    father  . GERD (gastroesophageal reflux disease)     Past Surgical History:  Procedure Laterality Date  . NO PAST SURGERIES      Current Medications: Current Meds  Medication Sig  . esomeprazole (NEXIUM) 40 MG capsule Take 40 mg by mouth daily at 12 noon.  . [DISCONTINUED] diltiazem (CARDIZEM) 120 MG tablet Take 120 mg by mouth daily.  . [DISCONTINUED] metoprolol succinate (TOPROL-XL) 25 MG 24 hr tablet TAKE 1/2 TABLET EVERY DAY     Allergies:   Patient has no known allergies.   Social History   Socioeconomic History  . Marital status: Married    Spouse name: Not on file  . Number of children: 2  .  Years of education: Not on file  . Highest education level: Not on file  Occupational History  . Occupation: Scientist, research (physical sciences): [employer]  Tobacco Use  . Smoking status: Never Smoker  . Smokeless tobacco: Never Used  Substance and Sexual Activity  . Alcohol use: No  . Drug use: No  . Sexual activity: Not on file  Other Topics Concern  . Not on file  Social History Narrative  . Not on file   Social Determinants of Health   Financial Resource Strain:   . Difficulty of Paying Living Expenses:   Food Insecurity:   . Worried About Charity fundraiser in the Last Year:   . Arboriculturist in the Last Year:   Transportation Needs:   . Film/video editor (Medical):   [name] Lack of Transportation (Non-Medical):   Physical Activity:   . Days of Exercise per Week:   . Minutes of Exercise per Session:   Stress:   . Feeling of Stress :   Social Connections:   . Frequency of Communication with Friends and Family:   . Frequency of Social Gatherings with Friends and Family:   . Attends Religious Services:   . Active Member of Clubs or Organizations:   .  Attends Archivist Meetings:   [name] Marital Status:      Family History: The patient's family history includes Colon cancer (age of onset: 93) in his father.  ROS:   Review of Systems  Constitution: Negative for decreased appetite, fever and weight gain.  HENT: Negative for congestion, ear discharge, hoarse voice and sore throat.   Eyes: Negative for discharge, redness, vision loss in right eye and visual halos.  Cardiovascular: Negative for chest pain, dyspnea on exertion, leg swelling, orthopnea and palpitations.  Respiratory: Negative for cough, hemoptysis, shortness of breath and snoring.   Endocrine: Negative for heat intolerance and polyphagia.  Hematologic/Lymphatic: Negative for bleeding problem. Does not bruise/bleed easily.  Skin: Negative for flushing, nail changes, rash and suspicious lesions.    Musculoskeletal: Negative for arthritis, joint pain, muscle cramps, myalgias, neck pain and stiffness.  Gastrointestinal: Negative for abdominal pain, bowel incontinence, diarrhea and excessive appetite.  Genitourinary: Negative for decreased libido, genital sores and incomplete emptying.  Neurological: Negative for brief paralysis, focal weakness, headaches and loss of balance.  Psychiatric/Behavioral: Negative for altered mental status, depression and suicidal ideas.  Allergic/Immunologic: Negative for HIV exposure and persistent infections.    EKGs/Labs/Other Studies Reviewed:    The following studies were reviewed today:   EKG:  The ekg ordered today demonstrates sinus rhythm, heart rate 70 bpm  Zio monitor  The patient wore the monitor for 7 days 11 hours starting March 03, 2019. Indication: Paroxysmal atrial fibrillation.  The minimum heart rate was 48 bpm, maximum heart rate was 156 bpm, and average heart rate was 78 bpm. Predominant underlying rhythm was Sinus Rhythm. First-degree AV block was present.  2 Supraventricular Tachycardia (which appeared to be short runs of atrial fibrillation) occurred, the run with the fastest interval lasting 5 beats with a max rate of 156 bpm, the longest lasting 8 beats with an avg rate of 121 bpm.  Premature atrial complexes were rare (less than 1%). Premature Ventricular complexes were rare (less than 1%). No ventricular tachycardia, no pauses, No second or third-degree AV block .  2 patient triggered events noted 1 associated with paroxysmal SVT and the other with sinus rhythm.  Conclusion: This study is symptomaticparoxysmal SVT with appears to be short runs of atrial fibrillation.  Recent Labs: No results found for requested labs within last 8760 hours.  Recent Lipid Panel No results found for: CHOL, TRIG, HDL, CHOLHDL, VLDL, LDLCALC, LDLDIRECT  Physical Exam:    VS:  BP 138/90   Pulse 70   Ht 6' (1.829 m)   Wt 269  lb (122 kg)   SpO2 96%   BMI 36.48 kg/m     Wt Readings from Last 3 Encounters:  06/09/19 269 lb (122 kg)  04/24/19 279 lb (126.6 kg)  03/03/19 280 lb (127 kg)     GEN: Well nourished, well developed in no acute distress HEENT: Normal NECK: No JVD; No carotid bruits LYMPHATICS: No lymphadenopathy CARDIAC: S1S2 noted,RRR, no murmurs, rubs, gallops RESPIRATORY:  Clear to auscultation without rales, wheezing or rhonchi  ABDOMEN: Soft, non-tender, non-distended, +bowel sounds, no guarding. EXTREMITIES: No edema, No cyanosis, no clubbing MUSCULOSKELETAL:  No deformity  SKIN: Warm and dry NEUROLOGIC:  Alert and oriented x 3, non-focal PSYCHIATRIC:  Normal affect, good insight  ASSESSMENT:    1. Paroxysmal A-fib ([place])   2. Essential hypertension   3. Obesity (BMI 30-39.9)    PLAN:     He has experiencing intermittent bursts of atrial fibrillation since  his last visit.  We discussed strongly antiarrhythmics today I am going to start him on flecainide 50 mg twice a day.  With this I will stop the beta-blocker and continue him on his calcium channel blocker.  Due to elevated blood pressure I am going to increase calcium channel blocker to to 40 mg daily.  He will follow up in 1 week for EKG to assess his QT.  QTc 408 ms in the office today.  PR interval 192 ms.  Hypertension-blood pressure 138/90 mmHg.  He states that his blood pressure has been higher given he has significant stress recently.  Hopefully with the increase in Cardizem this will help improve this.  Obesity the patient understands the need to lose weight with diet and exercise. We have discussed specific strategies for this.  The patient is in agreement with the above plan. The patient left the office in stable condition.  The patient will follow up in 1 week for EKG follow-up.   Medication Adjustments/Labs and Tests Ordered: Current medicines are reviewed at length with the patient today.  Concerns regarding medicines  are outlined above.  Orders Placed This Encounter  Procedures  . EKG 12-Lead   Meds ordered this encounter  Medications  . diltiazem (CARDIZEM CD) 240 MG 24 hr capsule    Sig: Take 1 capsule (240 mg total) by mouth daily.    Dispense:  90 capsule    Refill:  1  . flecainide (TAMBOCOR) 50 MG tablet    Sig: Take 1 tablet (50 mg total) by mouth 2 (two) times daily.    Dispense:  180 tablet    Refill:  1    Patient Instructions  Medication Instructions:  Your physician has recommended you make the following change in your medication:   STOP:Metoprolol   START: Flecainide 50 mg twice daily   INCREASE: Cardizem to 240 mg daily   *If you need a refill on your cardiac medications before your next appointment, please call your pharmacy*   Lab Work: None.  If you have labs (blood work) drawn today and your tests are completely normal, you will receive your results only by: [name] MyChart Message (if you have MyChart) OR . A paper copy in the mail If you have any lab test that is abnormal or we need to change your treatment, we will call you to review the results.   Testing/Procedures: None.    Follow-Up: At [facility], you and your health needs are our priority.  As part of our continuing mission to provide you with exceptional heart care, we have created designated Provider Care Teams.  These Care Teams include your primary Cardiologist (physician) and Advanced Practice Providers (APPs -  Physician Assistants and Nurse Practitioners) who all work together to provide you with the care you need, when you need it.  We recommend signing up for the patient portal called "MyChart".  Sign up information is provided on this After Visit Summary.  MyChart is used to connect with patients for Virtual Visits (Telemedicine).  Patients are able to view lab/test results, encounter notes, upcoming appointments, etc.  Non-urgent messages can be sent to your provider as well.   To learn more  about what you can do with MyChart, go to [email or web address].    Your next appointment:   3 month(s)  The format for your next appointment:   In Person  Provider:   Berniece Salines, DO   Other Instructions  You will need a ekg visit next  Friday.      Adopting a Healthy Lifestyle.  Know what a healthy weight is for you (roughly BMI <25) and aim to maintain this   Aim for 7+ servings of fruits and vegetables daily   65-80+ fluid ounces of water or unsweet tea for healthy kidneys   Limit to max 1 drink of alcohol per day; avoid smoking/tobacco   Limit animal fats in diet for cholesterol and heart health - choose grass fed whenever available   Avoid highly processed foods, and foods high in saturated/trans fats   Aim for low stress - take time to unwind and care for your mental health   Aim for 150 min of moderate intensity exercise weekly for heart health, and weights twice weekly for bone health   Aim for 7-9 hours of sleep daily   When it comes to diets, agreement about the perfect plan isnt easy to find, even among the experts. Experts at the [employer] developed an idea known as the Healthy Eating Plate. Just imagine a plate divided into logical, healthy portions.   The emphasis is on diet quality:   Load up on vegetables and fruits - one-half of your plate: Aim for color and variety, and remember that potatoes dont count.   Go for whole grains - one-quarter of your plate: Whole wheat, barley, wheat berries, quinoa, oats, brown rice, and foods made with them. If you want pasta, go with whole wheat pasta.   Protein power - one-quarter of your plate: Fish, chicken, beans, and nuts are all healthy, versatile protein sources. Limit red meat.   The diet, however, does go beyond the plate, offering a few other suggestions.   Use healthy plant oils, such as olive, canola, soy, corn, sunflower and peanut. Check the labels, and avoid partially  hydrogenated oil, which have unhealthy trans fats.   If youre thirsty, drink water. Coffee and tea are good in moderation, but skip sugary drinks and limit milk and dairy products to one or two daily servings.   The type of carbohydrate in the diet is more important than the amount. Some sources of carbohydrates, such as vegetables, fruits, whole grains, and beans-are healthier than others.   Finally, stay active  Signed, Berniece Salines, DO  06/09/2019 10:05 AM    [place]

## 2019-06-15 NOTE — Progress Notes (Signed)
Cardiology Office Note:    Date:  06/16/2019   ID:  Alexander James, DOB 1980/06/29, MRN [id number]  PCP:  [name], MD  Cardiologist:  Dr Harriet Masson  Referring MD: [name], MD    ASSESSMENT:    1. Paroxysmal A-fib ([place])   2. High risk medication use   3. Essential hypertension    PLAN:    In order of problems listed above:  For now continue antiarrhythmic therapy he is in sinus rhythm he is uncomfortable long-term would like to see EP alternative pulmonary vein isolation and I will refer him to Dr. Thompson Grayer Continue flecainide for now At target continue current treatment calcium channel blocker   Next appointment: As needed in the future after EP evaluation and treatment   Medication Adjustments/Labs and Tests Ordered: Current medicines are reviewed at length with the patient today.  Concerns regarding medicines are outlined above.  Orders Placed This Encounter  Procedures  . Ambulatory referral to Cardiac Electrophysiology  . EKG 12-Lead   No orders of the defined types were placed in this encounter.   Chief Complaint  Patient presents with  . Atrial Fibrillation    Started on flecainide    History of Present Illness:    Alexander James is a 39 y.o. male with a hx of  paroxysmal atrial fibrillation, GERD, hypertension last seen 06/09/2019 and initiated on flecainide. Compliance with diet, lifestyle and medications: Yes  He tolerates flecainide without side effect reports he has had no further episodes of atrial fibrillation.  Uses moderate alcohol and I gave him recommendations.  He would like to explore the option of pulmonary vein isolation and I told him in a young individual like him is at least equal and likely more effective than lifelong antiarrhythmic therapy he is worried about side effects he is worried about reentering healthcare system him admission to the hospital with atrial fibrillation and he is uncomfortable with his long-term  prognosis.  He request I will refer him for EP consultation for pulmonary vein isolation for atrial fibrillation.  He is not anticoagulated.  His CHADS2 score is relatively low at 1 with hypertension. Past Medical History:  Diagnosis Date  . Esophageal reflux 06/06/2013  . Essential hypertension 06/09/2019  . Family history of malignant neoplasm of gastrointestinal tract 06/06/2013   Father with colon cancer age 66   . Family hx of colon cancer    father  . GERD (gastroesophageal reflux disease)   . Paroxysmal A-fib ([place]) 06/09/2019    Past Surgical History:  Procedure Laterality Date  . NO PAST SURGERIES      Current Medications: Current Meds  Medication Sig  . diltiazem (CARDIZEM CD) 240 MG 24 hr capsule Take 1 capsule (240 mg total) by mouth daily.  [name] esomeprazole (NEXIUM) 40 MG capsule Take 40 mg by mouth daily at 12 noon.  . flecainide (TAMBOCOR) 50 MG tablet Take 1 tablet (50 mg total) by mouth 2 (two) times daily.     Allergies:   Patient has no known allergies.   Social History   Socioeconomic History  . Marital status: Married    Spouse name: Not on file  . Number of children: 2  . Years of education: Not on file  . Highest education level: Not on file  Occupational History  . 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 Smoking status: Never Smoker  . Smokeless tobacco: Never Used  Substance and Sexual Activity  . Alcohol use: No  . Drug use: No  . Sexual activity: Not on file  Other Topics Concern  . Not on file  Social History Narrative  . Not on file   Social Determinants of Health   Financial Resource Strain:   . Difficulty of Paying Living Expenses:   Food Insecurity:   . Worried About Charity fundraiser in the Last Year:   . Arboriculturist in the Last Year:   Transportation Needs:   . Film/video editor (Medical):   [name] Lack of Transportation (Non-Medical):   Physical Activity:   . Days of Exercise per Week:     . Minutes of Exercise per Session:   Stress:   . Feeling of Stress :   Social Connections:   . Frequency of Communication with Friends and Family:   . Frequency of Social Gatherings with Friends and Family:   . Attends Religious Services:   . Active Member of Clubs or Organizations:   . Attends Archivist Meetings:   [name] Marital Status:      Family History: The patient's family history includes Colon cancer (age of onset: 36) in his father. ROS:   Please see the history of present illness.    All other systems reviewed and are negative.  EKGs/Labs/Other Studies Reviewed:    The following studies were reviewed today:  EKG:  EKG ordered today and personally reviewed.  The ekg ordered today demonstrates sinus rhythm and is normal no evidence of A1c toxicity  Recent Labs: 03/24/2017 cholesterol 159 HDL 48 LDL 106  Physical Exam:    VS:  BP 126/78   Pulse 70   Temp 98.3 F (36.8 C)   Ht 6\' 1"  (1.854 m)   Wt 271 lb 6.4 oz (123.1 kg)   SpO2 96%   BMI 35.81 kg/m     Wt Readings from Last 3 Encounters:  06/16/19 271 lb 6.4 oz (123.1 kg)  06/09/19 269 lb (122 kg)  04/24/19 279 lb (126.6 kg)     GEN:  Well nourished, well developed in no acute distress HEENT: Normal NECK: No JVD; No carotid bruits LYMPHATICS: No lymphadenopathy CARDIAC: RRR, no murmurs, rubs, gallops RESPIRATORY:  Clear to auscultation without rales, wheezing or rhonchi  ABDOMEN: Soft, non-tender, non-distended MUSCULOSKELETAL:  No edema; No deformity  SKIN: Warm and dry NEUROLOGIC:  Alert and oriented x 3 PSYCHIATRIC:  Normal affect    Signed, Shirlee More, MD  06/16/2019 9:08 AM    Alexander James

## 2019-06-16 ENCOUNTER — Ambulatory Visit: Payer: Commercial Managed Care - PPO | Admitting: Cardiology

## 2019-06-16 ENCOUNTER — Other Ambulatory Visit: Payer: Self-pay

## 2019-06-16 ENCOUNTER — Encounter: Payer: Self-pay | Admitting: Cardiology

## 2019-06-16 VITALS — BP 126/78 | HR 70 | Temp 98.3°F | Ht 73.0 in | Wt 271.4 lb

## 2019-06-16 DIAGNOSIS — Z79899 Other long term (current) drug therapy: Secondary | ICD-10-CM

## 2019-06-16 DIAGNOSIS — I480 Paroxysmal atrial fibrillation: Secondary | ICD-10-CM

## 2019-06-16 DIAGNOSIS — I10 Essential (primary) hypertension: Secondary | ICD-10-CM | POA: Diagnosis not present

## 2019-06-16 NOTE — Patient Instructions (Signed)
Medication Instructions:  Your physician recommends that you continue on your current medications as directed. Please refer to the Current Medication list given to you today.  *If you need a refill on your cardiac medications before your next appointment, please call your pharmacy*   Lab Work: None If you have labs (blood work) drawn today and your tests are completely normal, you will receive your results only by: [name] MyChart Message (if you have MyChart) OR . A paper copy in the mail If you have any lab test that is abnormal or we need to change your treatment, we will call you to review the results.   Testing/Procedures: None   Follow-Up: At [facility], you and your health needs are our priority.  As part of our continuing mission to provide you with exceptional heart care, we have created designated Provider Care Teams.  These Care Teams include your primary Cardiologist (physician) and Advanced Practice Providers (APPs -  Physician Assistants and Nurse Practitioners) who all work together to provide you with the care you need, when you need it.  We recommend signing up for the patient portal called "MyChart".  Sign up information is provided on this After Visit Summary.  MyChart is used to connect with patients for Virtual Visits (Telemedicine).  Patients are able to view lab/test results, encounter notes, upcoming appointments, etc.  Non-urgent messages can be sent to your provider as well.   To learn more about what you can do with MyChart, go to [email or web address].    Your next appointment:   PRN Follow up  The format for your next appointment:   In Person  Provider:   Shirlee More, MD   Other Instructions We have put in a referral for you to see Dr. Thompson Grayer an electrophysiology doctor. They will call you to schedule this appointment.

## 2019-07-03 ENCOUNTER — Other Ambulatory Visit: Payer: Self-pay

## 2019-07-03 ENCOUNTER — Telehealth (INDEPENDENT_AMBULATORY_CARE_PROVIDER_SITE_OTHER): Payer: Commercial Managed Care - PPO | Admitting: Internal Medicine

## 2019-07-03 ENCOUNTER — Encounter: Payer: Self-pay | Admitting: Internal Medicine

## 2019-07-03 ENCOUNTER — Telehealth: Payer: Self-pay

## 2019-07-03 VITALS — Ht 73.0 in | Wt 260.0 lb

## 2019-07-03 DIAGNOSIS — I480 Paroxysmal atrial fibrillation: Secondary | ICD-10-CM | POA: Diagnosis not present

## 2019-07-03 DIAGNOSIS — I10 Essential (primary) hypertension: Secondary | ICD-10-CM

## 2019-07-03 DIAGNOSIS — E669 Obesity, unspecified: Secondary | ICD-10-CM | POA: Diagnosis not present

## 2019-07-03 DIAGNOSIS — I4891 Unspecified atrial fibrillation: Secondary | ICD-10-CM

## 2019-07-03 NOTE — Telephone Encounter (Signed)
LMTCB

## 2019-07-03 NOTE — Progress Notes (Signed)
Electrophysiology TeleHealth Note   Due to national recommendations of social distancing due to [place], Audio telehealth visit is felt to be most appropriate for this patient at this time.  See MyChart message from today for patient consent regarding telehealth for [facility].   Date:  07/03/2019   ID:  Alexander James, DOB 1980-03-03, MRN [id number]  Location: home  Provider location: [place] Evaluation Performed: New patient consult  PCP:  [name], MD  Cardiologist:  Dr Harriet Masson Electrophysiologist:  None   Chief Complaint:  afib  History of Present Illness:    Alexander James is a 39 y.o. male who presents via audio/video conferencing for a telehealth visit today.   The patient is referred for new consultation regarding afib by Dr Bettina Gavia.  He reports initially being diagnosed with atrial fibrillation 11/07/2018 (at his birthday).  He reports developed tachypalpitations after riding horses and drinking beer.  He had symptoms of fatigue and SOB. He presented to [facility] and documented to have afib.  He spontaneously converted to sinus rhythm.  He was evaluated by Dr Harriet Masson and started on metoprolol.  Lifestyle modification was advised.  He was placed on flecainide subsequently. He is doing well at this time.  Today, he denies symptoms of palpitations, chest pain, shortness of breath, orthopnea, PND, lower extremity edema, claudication, dizziness, presyncope, syncope, bleeding, or neurologic sequela. The patient is tolerating medications without difficulties and is otherwise without complaint today.     Past Medical History:  Diagnosis Date  . Essential hypertension 06/09/2019  . Family history of malignant neoplasm of gastrointestinal tract 06/06/2013   Father with colon cancer age 60   . Family hx of colon cancer    father  . GERD (gastroesophageal reflux disease)   . Overweight   . Paroxysmal A-fib ([place]) 06/09/2019    Past Surgical History:    Procedure Laterality Date  . NO PAST SURGERIES      Current Outpatient Medications  Medication Sig Dispense Refill  . diltiazem (CARDIZEM CD) 240 MG 24 hr capsule Take 1 capsule (240 mg total) by mouth daily. 90 capsule 1  . esomeprazole (NEXIUM) 40 MG capsule Take 40 mg by mouth daily at 12 noon.    . flecainide (TAMBOCOR) 50 MG tablet Take 1 tablet (50 mg total) by mouth 2 (two) times daily. 180 tablet 1   No current facility-administered medications for this visit.    Allergies:   Patient has no known allergies.   Social History:  The patient  reports that he has never smoked. He has never used smokeless tobacco. He reports current alcohol use. He reports that he does not use drugs.   Family History:  The patient's family history includes Colon cancer (age of onset: 78) in his father.    ROS:  Please see the history of present illness.   All other systems are personally reviewed and negative.    Exam:    Vital Signs:  Ht 6\' 1"  (1.854 m)   Wt 260 lb (117.9 kg)   BMI 34.30 kg/m    Well souding, alert and conversant    Labs/Other Tests and Data Reviewed:    Recent Labs: No results found for requested labs within last 8760 hours.   Wt Readings from Last 3 Encounters:  07/03/19 260 lb (117.9 kg)  06/16/19 271 lb 6.4 oz (123.1 kg)  06/09/19 269 lb (122 kg)     Other studies personally reviewed: Additional  studies/ records that were reviewed today include: Dr Quintella Reichert notes,  Dr Oren Binet notes,  Prior echo  Review of the above records today demonstrates: as above   ASSESSMENT & PLAN:    1.  Paroxysmal atrial fibrillation The patient has symptomatic, recurrent paroxysmal atrial fibrillation. he is doing reasonably well with flecainide but worries about long term effects Chads2vasc score is 1.  Guidelines do not require long term anticoagulation. Therapeutic strategies for afib including continuing medicine and ablation were discussed in detail with the patient today.  Risk, benefits, and alternatives to EP study and radiofrequency ablation for afib were also discussed in detail today. These risks include but are not limited to stroke, bleeding, vascular damage, tamponade, perforation, damage to the esophagus, lungs, and other structures, pulmonary vein stenosis, worsening renal function, and death. The patient understands these risk and wishes to proceed.  We will therefore proceed with catheter ablation at the next available time.  Carto, ICE, anesthesia are requested for the procedure.  Will also obtain cardiac CT prior to the procedure to exclude LAA thrombus and further evaluate atrial anatomy. Start xarelto 20mg  daily x 3 weeks prior to ablation.  2. Obesity Body mass index is 34.3 kg/m.  Lifestyle modification is advised  3. ETOH Avoidance is advised  4. HTN Stable No change required today   Patient Risk:  after full review of this patients clinical status, I feel that they are at moderate risk at this time.   Today, I have spent 20 minutes with the patient with telehealth technology discussing afib .    SignedThompson Grayer MD, [facility] 07/03/2019 12:11 PM   [place] [address] [address] [phone] (office) [phone] (fax)

## 2019-07-03 NOTE — Telephone Encounter (Signed)
-----   Message from Thompson Grayer, MD sent at 07/03/2019 12:17 PM EDT ----- Afib ablation CIACardiac CT Start xarelto 20mg  daily 3 weeks prior to ablation

## 2019-07-04 ENCOUNTER — Telehealth: Payer: Self-pay

## 2019-07-04 MED ORDER — RIVAROXABAN 20 MG PO TABS
20.0000 mg | ORAL_TABLET | Freq: Every day | ORAL | 11 refills | Status: DC
Start: 2019-10-11 — End: 2019-07-04

## 2019-07-04 MED ORDER — RIVAROXABAN 20 MG PO TABS: 20.0000 mg | ORAL_TABLET | Freq: Every day | ORAL | 11 refills | Status: DC

## 2019-07-04 MED ORDER — METOPROLOL TARTRATE 100 MG PO TABS
ORAL_TABLET | ORAL | 0 refills | Status: DC
Start: 2019-07-04 — End: 2019-07-04

## 2019-07-04 MED ORDER — METOPROLOL TARTRATE 100 MG PO TABS: ORAL_TABLET | ORAL | 0 refills | Status: DC

## 2019-07-04 NOTE — Telephone Encounter (Signed)
Pt's Wife returning your call

## 2019-07-04 NOTE — Telephone Encounter (Signed)
Call placed to Pt.  Pt would like to schedule afib ablation for November 09, 2019 d/t work commitments  Will plan to have labs on September 2--will meet and start Xarelto at that time.  Mailed prescriptions for Xarelto and metoprolol  Will give samples and cards at lab appt  Will meet to discuss instructions at that time.  Work up complete.  Reminder entered for this nurse on 9/2

## 2019-07-04 NOTE — Telephone Encounter (Signed)
Patient's wife is returning Jenny's call.

## 2019-07-20 ENCOUNTER — Other Ambulatory Visit: Payer: Self-pay | Admitting: Cardiology

## 2019-07-20 MED ORDER — DILTIAZEM HCL ER COATED BEADS 240 MG PO CP24: 240.0000 mg | ORAL_CAPSULE | Freq: Every day | ORAL | 1 refills | Status: DC

## 2019-07-20 MED ORDER — METOPROLOL TARTRATE 100 MG PO TABS: ORAL_TABLET | ORAL | 0 refills | Status: DC

## 2019-07-20 NOTE — Telephone Encounter (Signed)
*  STAT* If patient is at the pharmacy, call can be transferred to refill team.   1. Which medications need to be refilled? (please list name of each medication and dose if known) new prescriptions- changing pharmacy- Metoprolol and Diltiazem  2. Which pharmacy/location (including street and city if local pharmacy) is medication to be sent to? [facility]  3. Do they need a 30 day or 90 day supply? 90 days and refills

## 2019-07-20 NOTE — Telephone Encounter (Signed)
Refill requests sent

## 2019-08-03 ENCOUNTER — Other Ambulatory Visit: Payer: Self-pay | Admitting: Cardiology

## 2019-08-03 MED ORDER — FLECAINIDE ACETATE 50 MG PO TABS: 50.0000 mg | ORAL_TABLET | Freq: Two times a day (BID) | ORAL | 1 refills | Status: DC

## 2019-08-03 NOTE — Telephone Encounter (Signed)
Refill sent in per request.  

## 2019-08-03 NOTE — Telephone Encounter (Signed)
*  STAT* If patient is at the pharmacy, call can be transferred to refill team.   1. Which medications need to be refilled? (please list name of each medication and dose if known) flecainide (TAMBOCOR) 50 MG tablet  2. Which pharmacy/location (including street and city if local pharmacy) is medication to be sent to? [place]  3. Do they need a 30 day or 90 day supply? 90   Patient will be out of medication by Sunday.

## 2019-09-13 ENCOUNTER — Telehealth: Payer: Self-pay | Admitting: Internal Medicine

## 2019-09-13 NOTE — Telephone Encounter (Signed)
° ° °  Pt's wife calling, she would like to r/s covid test to 11/06/2019 so pt can do his CT and covid test on the same day.

## 2019-09-13 NOTE — Telephone Encounter (Signed)
Left voicemail to call office.

## 2019-09-14 NOTE — Telephone Encounter (Signed)
Returned call to pt's wife, Caryl Pina, [place] on file.  She has been made aware that we rescheduled pt's appointment for covid testing to the same day as his CT, 11/06/19 at 10:10.  She was grateful for the call back.

## 2019-09-14 NOTE — Telephone Encounter (Signed)
Patient's wife calling back. She states to call her to reschedule the covid test, because the patient is not able to answer his phone quickly.

## 2019-09-15 ENCOUNTER — Ambulatory Visit: Payer: Commercial Managed Care - PPO | Admitting: Cardiology

## 2019-10-09 ENCOUNTER — Telehealth: Payer: Self-pay | Admitting: Internal Medicine

## 2019-10-09 NOTE — Telephone Encounter (Signed)
° ° °  Pt said, her wife work in a hospital and she told him that surgeries might be cancelled sue to increase of covid 19 cases. He wanted to make sure his procedure wont be affected, because if its canceled then he doesn't want to waste the lab work for it.

## 2019-10-10 NOTE — Telephone Encounter (Signed)
Left voicemail to continue appointments as planned.

## 2019-10-12 ENCOUNTER — Telehealth: Payer: Self-pay

## 2019-10-12 ENCOUNTER — Other Ambulatory Visit: Payer: Self-pay

## 2019-10-12 ENCOUNTER — Other Ambulatory Visit: Payer: Commercial Managed Care - PPO

## 2019-10-12 DIAGNOSIS — I4891 Unspecified atrial fibrillation: Secondary | ICD-10-CM

## 2019-10-12 LAB — CBC WITH DIFFERENTIAL/PLATELET
Basophils Absolute: 0 10*3/uL (ref 0.0–0.2)
Basos: 1 %
EOS (ABSOLUTE): 0.1 10*3/uL (ref 0.0–0.4)
Eos: 2 %
Hematocrit: 44.8 % (ref 37.5–51.0)
Hemoglobin: 15.3 g/dL (ref 13.0–17.7)
Immature Grans (Abs): 0 10*3/uL (ref 0.0–0.1)
Immature Granulocytes: 0 %
Lymphocytes Absolute: 2.2 10*3/uL (ref 0.7–3.1)
Lymphs: 31 %
MCH: 30.7 pg (ref 26.6–33.0)
MCHC: 34.2 g/dL (ref 31.5–35.7)
MCV: 90 fL (ref 79–97)
Monocytes Absolute: 0.6 10*3/uL (ref 0.1–0.9)
Monocytes: 9 %
Neutrophils Absolute: 4 10*3/uL (ref 1.4–7.0)
Neutrophils: 57 %
Platelets: 268 10*3/uL (ref 150–450)
RBC: 4.98 x10E6/uL (ref 4.14–5.80)
RDW: 12.4 % (ref 11.6–15.4)
WBC: 6.9 10*3/uL (ref 3.4–10.8)

## 2019-10-12 LAB — BASIC METABOLIC PANEL
BUN/Creatinine Ratio: 13 (ref 9–20)
BUN: 11 mg/dL (ref 6–20)
CO2: 21 mmol/L (ref 20–29)
Calcium: 9.2 mg/dL (ref 8.7–10.2)
Chloride: 104 mmol/L (ref 96–106)
Creatinine, Ser: 0.85 mg/dL (ref 0.76–1.27)
GFR calc Af Amer: 128 mL/min/{1.73_m2} (ref >59)
GFR calc non Af Amer: 111 mL/min/{1.73_m2} (ref >59)
Glucose: 109 mg/dL — ABNORMAL HIGH (ref 65–99)
Potassium: 4.1 mmol/L (ref 3.5–5.2)
Sodium: 140 mmol/L (ref 134–144)

## 2019-10-12 MED ORDER — METOPROLOL TARTRATE 100 MG PO TABS: ORAL_TABLET | ORAL | 0 refills | Status: DC

## 2019-10-12 MED ORDER — RIVAROXABAN 20 MG PO TABS: 20.0000 mg | ORAL_TABLET | Freq: Every day | ORAL | 11 refills | Status: DC

## 2019-10-12 NOTE — Telephone Encounter (Signed)
Sent Xarelto and metoprolol to [facility] as requested during discussion today.

## 2019-10-12 NOTE — Addendum Note (Signed)
Addended by: Willeen Cass A on: 10/12/2019 09:32 AM   Modules accepted: Orders

## 2019-11-02 ENCOUNTER — Telehealth (HOSPITAL_COMMUNITY): Payer: Self-pay | Admitting: Emergency Medicine

## 2019-11-02 NOTE — Telephone Encounter (Signed)
Reaching out to patient to offer assistance regarding upcoming cardiac imaging study; pt verbalizes understanding of appt date/time, parking situation and where to check in, pre-test NPO status and medications ordered, and verified current allergies; name and call back number provided for further questions should they arise Aryaman Haliburton RN Navigator Cardiac Imaging [facility] [phone] office [phone] cell 

## 2019-11-06 ENCOUNTER — Other Ambulatory Visit: Payer: Self-pay

## 2019-11-06 ENCOUNTER — Ambulatory Visit (HOSPITAL_COMMUNITY)
Admission: RE | Admit: 2019-11-06 | Discharge: 2019-11-06 | Disposition: A | Discharge status: Home / Self Care | Payer: Commercial Managed Care - PPO | Source: Ambulatory Visit | Attending: Internal Medicine | Admitting: Internal Medicine

## 2019-11-06 ENCOUNTER — Other Ambulatory Visit (HOSPITAL_COMMUNITY)
Admission: RE | Admit: 2019-11-06 | Discharge: 2019-11-06 | Disposition: A | Discharge status: Home / Self Care | Payer: Commercial Managed Care - PPO | Source: Ambulatory Visit | Attending: Internal Medicine | Admitting: Internal Medicine

## 2019-11-06 DIAGNOSIS — R911 Solitary pulmonary nodule: Secondary | ICD-10-CM | POA: Insufficient documentation

## 2019-11-06 DIAGNOSIS — I4891 Unspecified atrial fibrillation: Secondary | ICD-10-CM

## 2019-11-06 DIAGNOSIS — Z01812 Encounter for preprocedural laboratory examination: Secondary | ICD-10-CM | POA: Diagnosis not present

## 2019-11-06 DIAGNOSIS — Z20822 Contact with and (suspected) exposure to covid-19: Secondary | ICD-10-CM | POA: Diagnosis not present

## 2019-11-06 DIAGNOSIS — K449 Diaphragmatic hernia without obstruction or gangrene: Secondary | ICD-10-CM | POA: Insufficient documentation

## 2019-11-06 LAB — SARS CORONAVIRUS 2 (TAT 6-24 HRS): SARS Coronavirus 2: NEGATIVE

## 2019-11-06 IMAGING — CT CT HEART MORPH/PULM VEIN W/ CM & W/O CA SCORE
2 of 7 series · 11 of 20 positions shown, 13 images · non-contrast
Comparison: Chest radiograph [DATE]
COMPARISON: Chest radiograph [DATE]

Addendum:
EXAM:
OVER-READ INTERPRETATION  CT CHEST

The following report is an over-read performed by radiologist Dr.
DIVINE [REDACTED] on [DATE]. This over-read
does not include interpretation of cardiac or coronary anatomy or
pathology. The coronary CTA interpretation by the cardiologist is
attached.
CLINICAL DATA: 38M with hypertension and atrial fibrillation
scheduled for an ablation.
Cardiac CT/CTA
TECHNIQUE: The patient was scanned on a Siemens Somatom scanner.

[Series 9: 0-95% · axial · 0.49mm/px · z∈[-378,-275]mm · 5 of 3090 slices shown]
[im 515/3090  vessel]
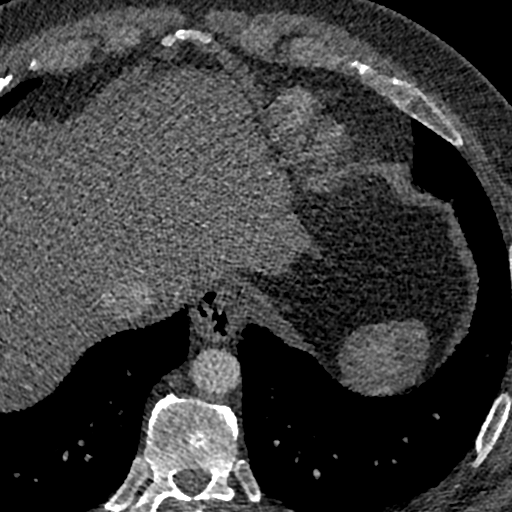
[im 1030/3090  vessel]
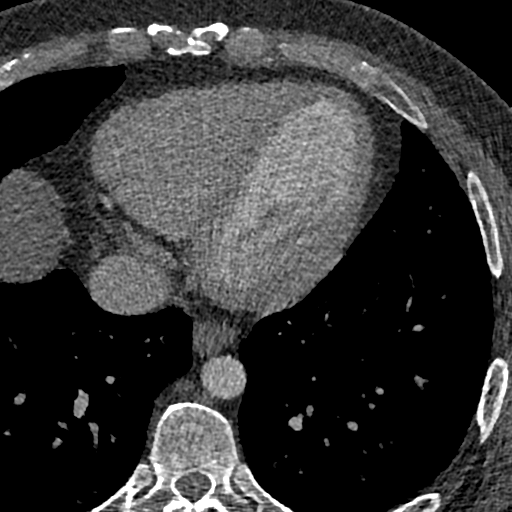
[im 1545/3090  vessel]
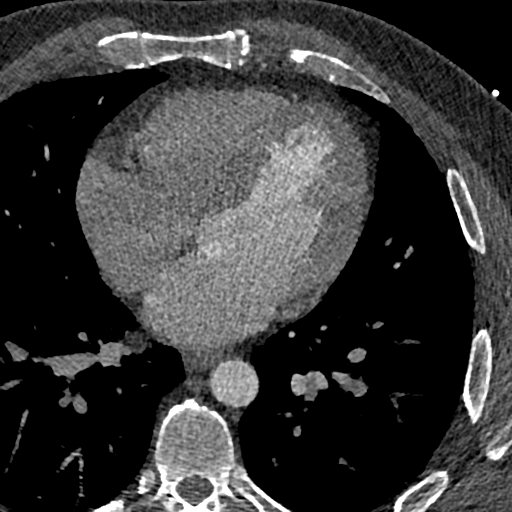
[im 2060/3090  vessel]
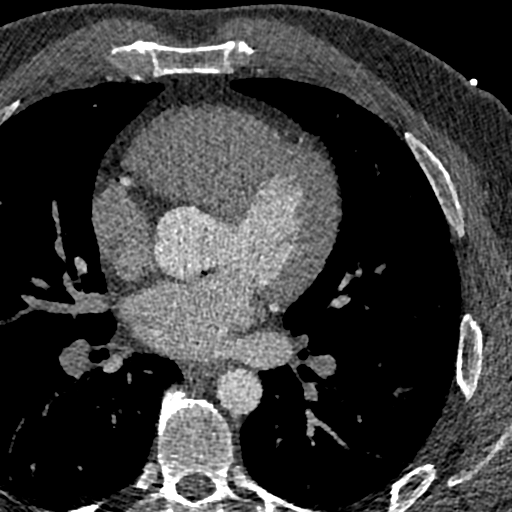
[im 2575/3090  vessel]
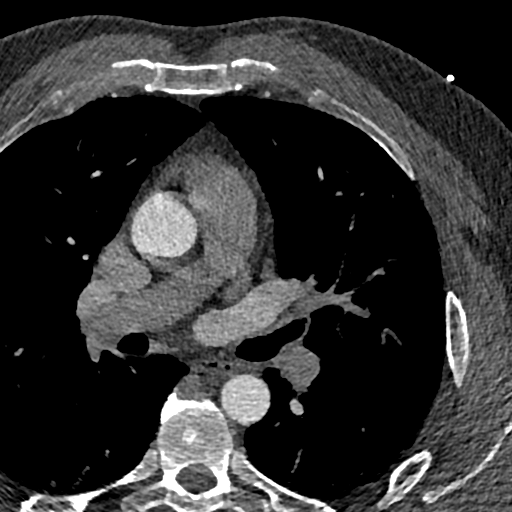

[Series 10: 5-95% · axial · 0.49mm/px · z∈[-382,-272]mm · 6 of 3090 slices shown, 8 images]
[im 442/3090  vessel]
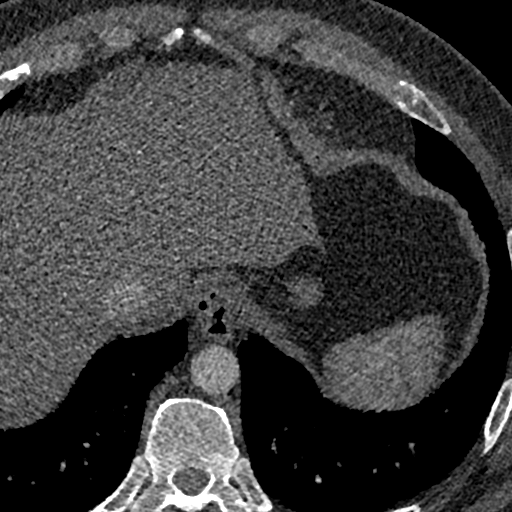
[im 442/3090  lung]
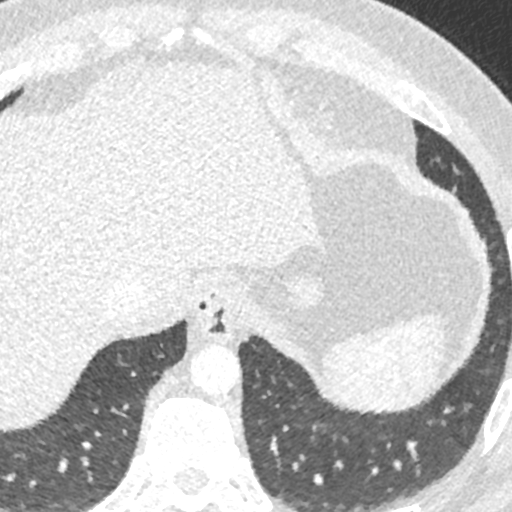
[im 883/3090  vessel]
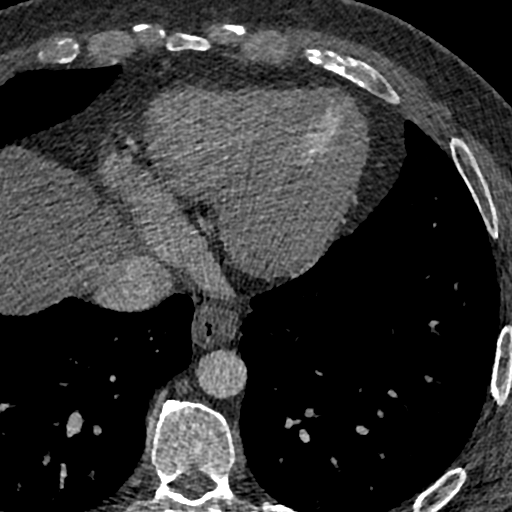
[im 1324/3090  vessel]
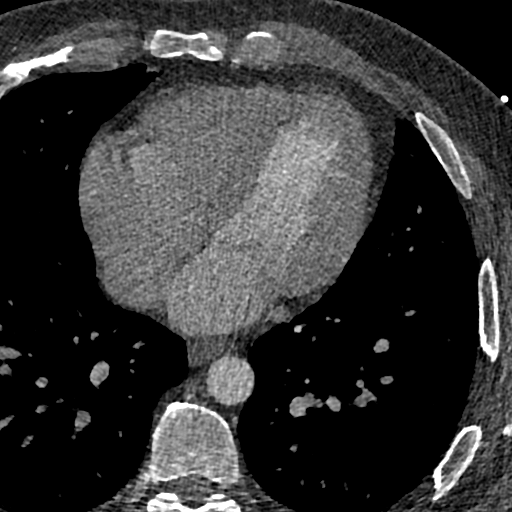
[im 1766/3090  vessel]
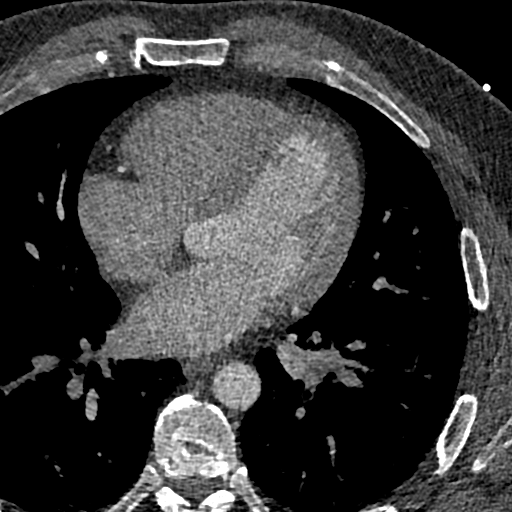
[im 2207/3090  vessel]
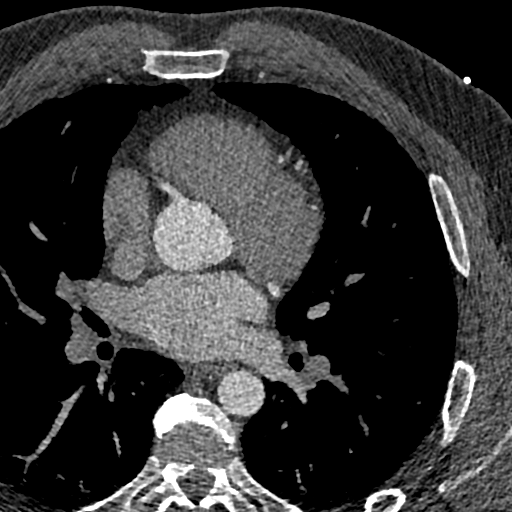
[im 2207/3090  lung]
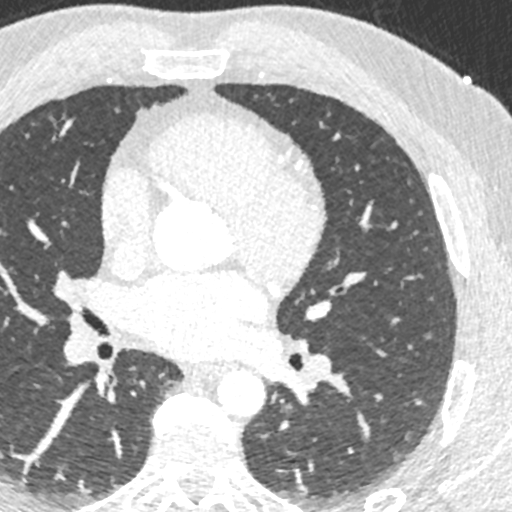
[im 2648/3090  vessel]
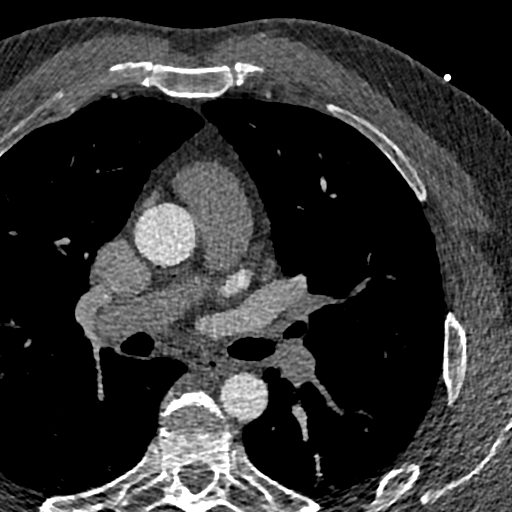

[11 of 20 positions shown; findings below may reference images not displayed]

FINDINGS: Vascular: Normal aortic caliber.

Mediastinum/Nodes: No mediastinal or hilar adenopathy. Tiny hiatal
hernia. Mild distal esophageal wall thickening on 58/2.

Lungs/Pleura: No pleural fluid. Isolated right middle lobe 3 mm
pulmonary nodule on 19/13.

Upper Abdomen: Normal imaged portions of the liver, spleen.

Musculoskeletal: No acute osseous abnormality.
IMPRESSION: 1. No acute findings in the imaged extracardiac chest.
2. Tiny hiatal hernia. Distal esophageal wall thickening, suggesting
esophagitis.
3. Isolated right middle lobe 3 mm pulmonary nodule. No follow-up
needed if patient is low-risk. Non-contrast chest CT can be
considered in 12 months if patient is high-risk. This recommendation
follows the consensus statement: Guidelines for Management of
Incidental Pulmonary Nodules Detected on CT Images: From the
FINDINGS: A 120 kV prospective scan was triggered in the descending thoracic
aorta at 111 HU's. Gantry rotation speed was 280 msecs and
collimation was .9 mm. No beta blockade and no NTG was given. The 3D
data set was reconstructed in 5% intervals of the 60-80 % of the R-R
cycle. Diastolic phases were analyzed on a dedicated work station
using MPR, MIP and VRT modes. The patient received 80 cc of
contrast.

There is normal pulmonary vein drainage into the left atrium (2 on
the right and 2 on the left) with ostial measurements as follows:

RUPV: 20.4 x 19.7 mm

RLPV: 21.9 x 20.4 mm

LUPV: 16.0 x 12.6 mm

LLPV: 25.2 x 13.0 mm

The left atrial appendage is large windsock type and ostial size
21.4 x 13.8 mm and length 23.7 mm. There is no thrombus in the left
atrial appendage.

The esophagus runs in the left atrial midline and is not in the
proximity to any of the pulmonary veins.

Aorta: Normal caliber. Ascending aorta 3.1 cm. No dissection or
calcifications.

Aortic Valve:  Trileaflet.  No calcifications.

Coronary Arteries: Normal coronary origin. Right dominance. The
study was performed without use of NTG and insufficient for plaque
evaluation. Scant LAD and RCA calcification. Coronary calcium score
7.28.
IMPRESSION: 1. There is normal pulmonary vein drainage into the left atrium.

2. The left atrial appendage is large windsock type and ostial size
21.4 x 13.8 mm and length 23.7 mm. There is no thrombus in the left
atrial appendage.

3. The esophagus runs in the left atrial midline and is not in the
proximity to any of the pulmonary veins.

4. Scant LAD and RCA calcification. Coronary calcium score 7.28.
Age/sex based comparisons begin at age 45. If patient were 45, this
score would be 81st percentile for age/sex matched controls.

*** End of Addendum ***
EXAM:
OVER-READ INTERPRETATION  CT CHEST

The following report is an over-read performed by radiologist Dr.
DIVINE [REDACTED] on [DATE]. This over-read
does not include interpretation of cardiac or coronary anatomy or
pathology. The coronary CTA interpretation by the cardiologist is
attached.
FINDINGS: Vascular: Normal aortic caliber.

Mediastinum/Nodes: No mediastinal or hilar adenopathy. Tiny hiatal
hernia. Mild distal esophageal wall thickening on 58/2.

Lungs/Pleura: No pleural fluid. Isolated right middle lobe 3 mm
pulmonary nodule on 19/13.

Upper Abdomen: Normal imaged portions of the liver, spleen.

Musculoskeletal: No acute osseous abnormality.
IMPRESSION: 1. No acute findings in the imaged extracardiac chest.
2. Tiny hiatal hernia. Distal esophageal wall thickening, suggesting
esophagitis.
3. Isolated right middle lobe 3 mm pulmonary nodule. No follow-up
needed if patient is low-risk. Non-contrast chest CT can be
considered in 12 months if patient is high-risk. This recommendation
follows the consensus statement: Guidelines for Management of
Incidental Pulmonary Nodules Detected on CT Images: From the

## 2019-11-06 MED ORDER — IOHEXOL 350 MG/ML SOLN
80.0000 mL | Freq: Once | INTRAVENOUS | Status: AC | PRN
  Administered 2019-11-06: 80 mL via INTRAVENOUS

## 2019-11-07 ENCOUNTER — Other Ambulatory Visit (HOSPITAL_COMMUNITY): Payer: Commercial Managed Care - PPO

## 2019-11-08 NOTE — Progress Notes (Signed)
Instructed patient on the following items: Arrival time 0530 Nothing to eat or drink after midnight No meds AM of procedure Responsible person to drive you home and stay with you for 24 hrs  Have you missed any doses of anti-coagulant Xarelto- no doses missed

## 2019-11-09 ENCOUNTER — Ambulatory Visit (HOSPITAL_COMMUNITY): Payer: Commercial Managed Care - PPO

## 2019-11-09 ENCOUNTER — Ambulatory Visit (HOSPITAL_COMMUNITY)
Admission: RE | Disposition: A | Discharge status: Home / Self Care | Payer: Commercial Managed Care - PPO | Source: Home / Self Care | Attending: Internal Medicine

## 2019-11-09 ENCOUNTER — Ambulatory Visit (HOSPITAL_COMMUNITY)
Admission: RE | Admit: 2019-11-09 | Discharge: 2019-11-09 | Disposition: A | Discharge status: Home / Self Care | Payer: Commercial Managed Care - PPO | Attending: Internal Medicine | Admitting: Internal Medicine

## 2019-11-09 ENCOUNTER — Encounter (HOSPITAL_COMMUNITY): Payer: Self-pay | Admitting: Internal Medicine

## 2019-11-09 ENCOUNTER — Other Ambulatory Visit: Payer: Self-pay

## 2019-11-09 DIAGNOSIS — Z79899 Other long term (current) drug therapy: Secondary | ICD-10-CM | POA: Insufficient documentation

## 2019-11-09 DIAGNOSIS — I480 Paroxysmal atrial fibrillation: Secondary | ICD-10-CM

## 2019-11-09 DIAGNOSIS — K219 Gastro-esophageal reflux disease without esophagitis: Secondary | ICD-10-CM | POA: Insufficient documentation

## 2019-11-09 DIAGNOSIS — I10 Essential (primary) hypertension: Secondary | ICD-10-CM | POA: Insufficient documentation

## 2019-11-09 HISTORY — PX: ATRIAL FIBRILLATION ABLATION: EP1191

## 2019-11-09 LAB — POCT ACTIVATED CLOTTING TIME
Activated Clotting Time: 241 seconds
Activated Clotting Time: 268 seconds

## 2019-11-09 SURGERY — ATRIAL FIBRILLATION ABLATION
Anesthesia: General

## 2019-11-09 MED ORDER — FENTANYL CITRATE (PF) 100 MCG/2ML IJ SOLN
INTRAMUSCULAR | Status: DC | PRN
  Administered 2019-11-09: 100 ug via INTRAVENOUS

## 2019-11-09 MED ORDER — SODIUM CHLORIDE 0.9% FLUSH: 3.0000 mL | INTRAVENOUS | Status: DC | PRN

## 2019-11-09 MED ORDER — HEPARIN SODIUM (PORCINE) 1000 UNIT/ML IJ SOLN
INTRAMUSCULAR | Status: AC
  Filled 2019-11-09: qty 1

## 2019-11-09 MED ORDER — HEPARIN SODIUM (PORCINE) 1000 UNIT/ML IJ SOLN
INTRAMUSCULAR | Status: DC | PRN
  Administered 2019-11-09: 1000 [IU] via INTRAVENOUS
  Administered 2019-11-09: 14000 [IU] via INTRAVENOUS

## 2019-11-09 MED ORDER — SODIUM CHLORIDE 0.9 % IV SOLN: INTRAVENOUS | Status: DC

## 2019-11-09 MED ORDER — LIDOCAINE 2% (20 MG/ML) 5 ML SYRINGE
INTRAMUSCULAR | Status: DC | PRN
  Administered 2019-11-09: 100 mg via INTRAVENOUS

## 2019-11-09 MED ORDER — SODIUM CHLORIDE 0.9% FLUSH: 3.0000 mL | Freq: Two times a day (BID) | INTRAVENOUS | Status: DC

## 2019-11-09 MED ORDER — ONDANSETRON HCL 4 MG/2ML IJ SOLN: 4.0000 mg | Freq: Four times a day (QID) | INTRAMUSCULAR | Status: DC | PRN

## 2019-11-09 MED ORDER — SODIUM CHLORIDE 0.9 % IV SOLN: 250.0000 mL | INTRAVENOUS | Status: DC | PRN

## 2019-11-09 MED ORDER — ACETAMINOPHEN 325 MG PO TABS: 650.0000 mg | ORAL_TABLET | ORAL | Status: DC | PRN

## 2019-11-09 MED ORDER — ROCURONIUM BROMIDE 10 MG/ML (PF) SYRINGE
PREFILLED_SYRINGE | INTRAVENOUS | Status: DC | PRN
  Administered 2019-11-09: 60 mg via INTRAVENOUS

## 2019-11-09 MED ORDER — FENTANYL CITRATE (PF) 100 MCG/2ML IJ SOLN
INTRAMUSCULAR | Status: AC
  Filled 2019-11-09: qty 2

## 2019-11-09 MED ORDER — SUGAMMADEX SODIUM 200 MG/2ML IV SOLN
INTRAVENOUS | Status: DC | PRN
  Administered 2019-11-09: 200 mg via INTRAVENOUS

## 2019-11-09 MED ORDER — ISOPROTERENOL HCL 0.2 MG/ML IJ SOLN
INTRAMUSCULAR | Status: AC
  Filled 2019-11-09: qty 5

## 2019-11-09 MED ORDER — ONDANSETRON HCL 4 MG/2ML IJ SOLN
INTRAMUSCULAR | Status: DC | PRN
  Administered 2019-11-09: 4 mg via INTRAVENOUS

## 2019-11-09 MED ORDER — ISOPROTERENOL HCL 0.2 MG/ML IJ SOLN
INTRAVENOUS | Status: DC | PRN
  Administered 2019-11-09: 20 ug/min via INTRAVENOUS

## 2019-11-09 MED ORDER — HEPARIN SODIUM (PORCINE) 1000 UNIT/ML IJ SOLN
INTRAMUSCULAR | Status: DC | PRN
  Administered 2019-11-09 (×2): 5000 [IU] via INTRAVENOUS

## 2019-11-09 MED ORDER — HYDROCODONE-ACETAMINOPHEN 5-325 MG PO TABS: 1.0000 | ORAL_TABLET | ORAL | Status: DC | PRN

## 2019-11-09 MED ORDER — PROTAMINE SULFATE 10 MG/ML IV SOLN
INTRAVENOUS | Status: DC | PRN
  Administered 2019-11-09: 30 mg via INTRAVENOUS

## 2019-11-09 MED ORDER — MIDAZOLAM HCL 5 MG/5ML IJ SOLN
INTRAMUSCULAR | Status: AC
  Filled 2019-11-09: qty 5

## 2019-11-09 MED ORDER — PROPOFOL 10 MG/ML IV BOLUS
INTRAVENOUS | Status: DC | PRN
  Administered 2019-11-09: 200 mg via INTRAVENOUS

## 2019-11-09 MED ORDER — HEPARIN (PORCINE) IN NACL 2-0.9 UNITS/ML
INTRAMUSCULAR | Status: AC | PRN
  Administered 2019-11-09: 500 mL via INTRAVENOUS

## 2019-11-09 MED ORDER — DEXAMETHASONE SODIUM PHOSPHATE 10 MG/ML IJ SOLN
INTRAMUSCULAR | Status: DC | PRN
  Administered 2019-11-09: 10 mg via INTRAVENOUS

## 2019-11-09 MED ORDER — MIDAZOLAM HCL 2 MG/2ML IJ SOLN
INTRAMUSCULAR | Status: DC | PRN
  Administered 2019-11-09: 2 mg via INTRAVENOUS

## 2019-11-09 SURGICAL SUPPLY — 20 items
BLANKET WARM UNDERBOD FULL ACC (MISCELLANEOUS) ×2 IMPLANT
CATH 8FR REPROCESSED SOUNDSTAR (CATHETERS) ×2 IMPLANT
CATH MAPPNG PENTARAY F 2-6-2MM (CATHETERS) ×1 IMPLANT
CATH SMTCH THERMOCOOL SF DF (CATHETERS) ×2 IMPLANT
CATH WEBSTER BI DIR CS D-F CRV (CATHETERS) ×2 IMPLANT
COVER SWIFTLINK CONNECTOR (BAG) ×2 IMPLANT
DEVICE CLOSURE PERCLS PRGLD 6F (VASCULAR PRODUCTS) ×3 IMPLANT
NEEDLE TRANSEP BRK 71CM 407200 (NEEDLE) IMPLANT
NEEDLE TRANSSEPTAL BRK 98CM (NEEDLE) ×2 IMPLANT
PACK EP LATEX FREE (CUSTOM PROCEDURE TRAY) ×2
PACK EP LF (CUSTOM PROCEDURE TRAY) ×1 IMPLANT
PAD PRO RADIOLUCENT 2001M-C (PAD) ×2 IMPLANT
PATCH CARTO3 (PAD) ×2 IMPLANT
PENTARAY F 2-6-2MM (CATHETERS) ×2
PERCLOSE PROGLIDE 6F (VASCULAR PRODUCTS) ×6
SHEATH PINNACLE 7F 10CM (SHEATH) ×4 IMPLANT
SHEATH PINNACLE 9F 10CM (SHEATH) ×2 IMPLANT
SHEATH PROBE COVER 6X72 (BAG) ×2 IMPLANT
SHEATH SWARTZ TS SL2 81CM 8.5F (SHEATH) ×2 IMPLANT
TUBING SMART ABLATE COOLFLOW (TUBING) ×2 IMPLANT

## 2019-11-09 NOTE — H&P (Signed)
Chief Complaint:  afib  History of Present Illness:    Alexander James is a 39 y.o. male who presents today for afib ablation.  He reports initially being diagnosed with atrial fibrillation 11/[birth year]/2018 (at his birthday).  He reports developed tachypalpitations after riding horses and drinking beer.  He had symptoms of fatigue and SOB. He presented to [facility] and documented to have afib.  He spontaneously converted to sinus rhythm.  He was evaluated by Dr Harriet Masson and started on metoprolol.  Lifestyle modification was advised.  He was placed on flecainide subsequently. He is doing well at this time.  Today, he denies symptoms of palpitations, chest pain, shortness of breath, orthopnea, PND, lower extremity edema, claudication, dizziness, presyncope, syncope, bleeding, or neurologic sequela. The patient is tolerating medications without difficulties and is otherwise without complaint today.         Past Medical History:  Diagnosis Date  . Essential hypertension 06/09/2019  . Family history of malignant neoplasm of gastrointestinal tract 06/06/2013   Father with colon cancer age 108   . Family hx of colon cancer    father  . GERD (gastroesophageal reflux disease)   . Overweight   . Paroxysmal A-fib ([place]) 06/09/2019         Past Surgical History:  Procedure Laterality Date  . NO PAST SURGERIES            Current Outpatient Medications  Medication Sig Dispense Refill  . diltiazem (CARDIZEM CD) 240 MG 24 hr capsule Take 1 capsule (240 mg total) by mouth daily. 90 capsule 1  . esomeprazole (NEXIUM) 40 MG capsule Take 40 mg by mouth daily at 12 noon.    . flecainide (TAMBOCOR) 50 MG tablet Take 1 tablet (50 mg total) by mouth 2 (two) times daily. 180 tablet 1   No current facility-administered medications for this visit.    Allergies:   Patient has no known allergies.   Social History:  The patient  reports that he has never smoked. He has never used smokeless  tobacco. He reports current alcohol use. He reports that he does not use drugs.   Family History:  The patient's family history includes Colon cancer (age of onset: 21) in his father.    ROS:  Please see the history of present illness.   All other systems are personally reviewed and negative.   Physical Exam: Vitals:   11/09/19 0537  BP: 133/74  Pulse: 62  Temp: 98.6 F (37 C)  TempSrc: Oral  SpO2: 99%  Weight: 122.5 kg  Height: 6\' 1"  (1.854 m)    GEN- The patient is well appearing, alert and oriented x 3 today.   Head- normocephalic, atraumatic Eyes-  Sclera clear, conjunctiva pink Ears- hearing intact Oropharynx- clear Neck- supple, Lungs-  , normal work of breathing Heart- Regular rate and rhythm  GI- soft,   Extremities- no clubbing, cyanosis, or edema,  MS- no significant deformity or atrophy Skin- no rash or lesion Psych- euthymic mood, full affect Neuro- strength and sensation are intact    Labs/Other Tests and Data Reviewed:    Recent Labs: No results found for requested labs within last 8760 hours.      Wt Readings from Last 3 Encounters:  07/03/19 260 lb (117.9 kg)  06/16/19 271 lb 6.4 oz (123.1 kg)  06/09/19 269 lb (122 kg)     Other studies personally reviewed: Additional studies/ records that were reviewed today include: Dr Quintella Reichert notes,  Dr Oren Binet notes,  Prior echo  Review of the above records today demonstrates: as above   ASSESSMENT & PLAN:    1.  Paroxysmal atrial fibrillation The patient has symptomatic, recurrent paroxysmal atrial fibrillation. he is doing reasonably well with flecainide but worries about long term effects Chads2vasc score is 1.    Risk, benefits, and alternatives to EP study and radiofrequency ablation for afib were also discussed in detail today. These risks include but are not limited to stroke, bleeding, vascular damage, tamponade, perforation, damage to the esophagus, lungs, and other structures,  pulmonary vein stenosis, worsening renal function, and death. The patient understands these risk and wishes to proceed.    Cardiac CT is reviewed with him at length today.  He reports compliance with xarelto without interruption.  Thompson Grayer MD, [facility] 11/09/2019 7:11 AM

## 2019-11-09 NOTE — Anesthesia Procedure Notes (Addendum)
Procedure Name: Intubation Date/Time: 11/09/2019 9:35 AM Performed by: Genelle Bal, CRNA Pre-anesthesia Checklist: Patient identified, Emergency Drugs available, Suction available and Patient being monitored Patient Re-evaluated:Patient Re-evaluated prior to induction Oxygen Delivery Method: Circle system utilized Preoxygenation: Pre-oxygenation with 100% oxygen Induction Type: IV induction Ventilation: Mask ventilation without difficulty and Oral airway inserted - appropriate to patient size Laryngoscope Size: Mac and 4 Grade View: Grade II Tube type: Oral Tube size: 7.5 mm Number of attempts: 1 Airway Equipment and Method: Stylet and Oral airway Placement Confirmation: ETT inserted through vocal cords under direct vision,  positive ETCO2 and breath sounds checked- equal and bilateral Secured at: 22 cm Tube secured with: Tape Dental Injury: Teeth and Oropharynx as per pre-operative assessment  Comments: Performed by Heide Scales, SRNA

## 2019-11-09 NOTE — Anesthesia Preprocedure Evaluation (Addendum)
Anesthesia Evaluation  Patient identified by MRN, date of birth, ID band Patient awake    Reviewed: Patient's Chart, lab work & pertinent test results  Airway Mallampati: II  TM Distance: >3 FB Neck ROM: Full    Dental  (+) Teeth Intact   Pulmonary neg pulmonary ROS,    Pulmonary exam normal        Cardiovascular hypertension, Pt. on medications + dysrhythmias Atrial Fibrillation  Rhythm:Irregular Rate:Normal     Neuro/Psych negative neurological ROS  negative psych ROS   GI/Hepatic Neg liver ROS, GERD  Medicated,  Endo/Other  negative endocrine ROS  Renal/GU negative Renal ROS  negative genitourinary   Musculoskeletal   Abdominal Normal abdominal exam  (+)  Abdomen: soft. Bowel sounds: normal.  Peds  Hematology negative hematology ROS (+)   Anesthesia Other Findings   Reproductive/Obstetrics                            Anesthesia Physical Anesthesia Plan  ASA: III  Anesthesia Plan: General   Post-op Pain Management:    Induction:   PONV Risk Score and Plan: 2 and Ondansetron and Dexamethasone  Airway Management Planned: Mask and Oral ETT  Additional Equipment: None  Intra-op Plan:   Post-operative Plan: Extubation in OR  Informed Consent: I have reviewed the patients History and Physical, chart, labs and discussed the procedure including the risks, benefits and alternatives for the proposed anesthesia with the patient or authorized representative who has indicated his/her understanding and acceptance.     Dental advisory given  Plan Discussed with: CRNA  Anesthesia Plan Comments: (Lab Results      Component                Value               Date                      WBC                      6.9                 10/12/2019                HGB                      15.3                10/12/2019                HCT                      44.8                10/12/2019                 MCV                      90                  10/12/2019                PLT                      268  10/12/2019           Covid-19 Nucleic Acid Test Results Lab Results      Component                Value               Date                      [id number]              NEGATIVE            11/06/2019          )       Anesthesia Quick Evaluation

## 2019-11-09 NOTE — Discharge Instructions (Signed)
Post procedure care instructions   TAKE XARELTO TONIGHT No driving for 4 days. No lifting over 5 lbs for 1 week. No vigorous or sexual activity for 1 week. You may return to work/your usual activities on 11/16/19. Keep procedure site clean & dry. If you notice increased pain, swelling, bleeding or pus, call/return!  You may shower, but no soaking baths/hot tubs/pools for 1 week.     You have an appointment set up with the [facility].  Multiple studies have shown that being followed by a dedicated atrial fibrillation clinic in addition to the standard care you receive from your other physicians improves health. We believe that enrollment in the atrial fibrillation clinic will allow [place] to better care for you.   The phone number to the [facility] is [phone]. The clinic is staffed Monday through Friday from 8:30am to 5pm.  Parking Directions: The clinic is located in the Heart and Vascular Building connected to [facility]. 1)From [address] turn on to [address] and go to the 3rd entrance  (Heart and Vascular entrance) on the right. 2)Look to the right for Heart &Vascular Parking Garage. 3)A code for the entrance is required, for October is 3009.   4)Take the elevators to the 1st floor. Registration is in the room with the glass walls at the end of the hallway.  If you have any trouble parking or locating the clinic, please don't hesitate to call [phone].   Cardiac Ablation, Care After This sheet gives you information about how to care for yourself after your procedure. Your health care provider may also give you more specific instructions. If you have problems or questions, contact your health care provider. What can I expect after the procedure? After the procedure, it is common to have:  Bruising around your puncture site.  Tenderness around your puncture site.  Skipped heartbeats.  Tiredness (fatigue). Follow these  instructions at home: Puncture site care   Follow instructions from your health care provider about how to take care of your puncture site. Make sure you: ? Wash your hands with soap and water before you change your bandage (dressing). If soap and water are not available, use hand sanitizer. ? Change your dressing as told by your health care provider. ? Leave stitches (sutures), skin glue, or adhesive strips in place. These skin closures may need to stay in place for up to 2 weeks. If adhesive strip edges start to loosen and curl up, you may trim the loose edges. Do not remove adhesive strips completely unless your health care provider tells you to do that.  Check your puncture site every day for signs of infection. Check for: ? Redness, swelling, or pain. ? Fluid or blood. If your puncture site starts to bleed, lie down on your back, apply firm pressure to the area, and contact your health care provider. ? Warmth. ? Pus or a bad smell. Driving  Ask your health care provider when it is safe for you to drive again after the procedure.  Do not drive or use heavy machinery while taking prescription pain medicine.  Do not drive for 24 hours if you were given a medicine to help you relax (sedative) during your procedure. Activity  Avoid activities that take a lot of effort for at least 3 days after your procedure.  Do not lift anything that is heavier than 10 lb (4.5 kg), or the limit that you are told, until your health care provider says that  it is safe.  Return to your normal activities as told by your health care provider. Ask your health care provider what activities are safe for you. General instructions  Take over-the-counter and prescription medicines only as told by your health care provider.  Do not use any products that contain nicotine or tobacco, such as cigarettes and e-cigarettes. If you need help quitting, ask your health care provider.  Do not take baths, swim, or use a  hot tub until your health care provider approves.  Do not drink alcohol for 24 hours after your procedure.  Keep all follow-up visits as told by your health care provider. This is important. Contact a health care provider if:  You have redness, mild swelling, or pain around your puncture site.  You have fluid or blood coming from your puncture site that stops after applying firm pressure to the area.  Your puncture site feels warm to the touch.  You have pus or a bad smell coming from your puncture site.  You have a fever.  You have chest pain or discomfort that spreads to your neck, jaw, or arm.  You are sweating a lot.  You feel nauseous.  You have a fast or irregular heartbeat.  You have shortness of breath.  You are dizzy or light-headed and feel the need to lie down.  You have pain or numbness in the arm or leg closest to your puncture site. Get help right away if:  Your puncture site suddenly swells.  Your puncture site is bleeding and the bleeding does not stop after applying firm pressure to the area. These symptoms may represent a serious problem that is an emergency. Do not wait to see if the symptoms will go away. Get medical help right away. Call your local emergency services (911 in the [place] Do not drive yourself to the hospital. Summary  After the procedure, it is normal to have bruising and tenderness at the puncture site in your groin, neck, or forearm.  Check your puncture site every day for signs of infection.  Get help right away if your puncture site is bleeding and the bleeding does not stop after applying firm pressure to the area. This is a medical emergency. This information is not intended to replace advice given to you by your health care provider. Make sure you discuss any questions you have with your health care provider. Document Revised: 01/08/2017 Document Reviewed: 05/07/2016 Elsevier Patient Education  [place].

## 2019-11-09 NOTE — Transfer of Care (Signed)
Immediate Anesthesia Transfer of Care Note  Patient: Alexander James  Procedure(s) Performed: ATRIAL FIBRILLATION ABLATION (N/A )  Patient Location: PACU  Anesthesia Type:General  Level of Consciousness: awake, alert  and oriented  Airway & Oxygen Therapy: Patient Spontanous Breathing  Post-op Assessment: Report given to RN and Post -op Vital signs reviewed and stable  Post vital signs: Reviewed and stable  Last Vitals:  Vitals Value Taken Time  BP 115/72   Temp    Pulse 84 11/09/19 1158  Resp 19 11/09/19 1158  SpO2 98 % 11/09/19 1158  Vitals shown include unvalidated device data.  Last Pain:  Vitals:   11/09/19 0604  TempSrc:   PainSc: 0-No pain      Patients Stated Pain Goal: 3 (61/16/43 5391)  Complications: No complications documented.

## 2019-11-09 NOTE — Anesthesia Postprocedure Evaluation (Signed)
Anesthesia Post Note  Patient: Alexander James  Procedure(s) Performed: ATRIAL FIBRILLATION ABLATION (N/A )     Patient location during evaluation: PACU Anesthesia Type: General Level of consciousness: awake and alert Pain management: pain level controlled Vital Signs Assessment: post-procedure vital signs reviewed and stable Respiratory status: spontaneous breathing, nonlabored ventilation, respiratory function stable and patient connected to nasal cannula oxygen Cardiovascular status: blood pressure returned to baseline and stable Postop Assessment: no apparent nausea or vomiting Anesthetic complications: no   No complications documented.  Last Vitals:  Vitals:   11/09/19 1300 11/09/19 1315  BP: 137/76 (!) 144/84  Pulse: 76 75  Resp: 14 18  Temp:    SpO2: 95% 97%    Last Pain:  Vitals:   11/09/19 1248  TempSrc:   PainSc: 4                  Deane Wattenbarger P Quamel Fitzmaurice

## 2019-11-10 ENCOUNTER — Encounter (HOSPITAL_COMMUNITY): Payer: Self-pay | Admitting: Internal Medicine

## 2019-11-10 MED FILL — Midazolam HCl Inj 5 MG/5ML (Base Equivalent): INTRAMUSCULAR | Qty: 2 | Status: AC

## 2019-11-16 ENCOUNTER — Telehealth: Payer: Self-pay | Admitting: Internal Medicine

## 2019-11-16 NOTE — Telephone Encounter (Signed)
Spoke to the patient in regards to his paperwork. He verbalized understanding.

## 2019-11-16 NOTE — Telephone Encounter (Signed)
New message ° ° ° ° °Returning a call to the nurse °

## 2019-11-17 NOTE — Telephone Encounter (Signed)
   Pt's wife would like to speak with RN Otila Kluver again

## 2019-11-17 NOTE — Telephone Encounter (Signed)
I spoke to [place] at the request of [place]. Asked to additional paperwork before paperwork at office could be signed.  Caryl Pina verbalized understanding.

## 2019-11-22 NOTE — Telephone Encounter (Signed)
Spoke to the patient and let him know that we did not receive the additional paper work via fax. Asking if him or his wife could resend it.    (Also see mychart message from 8/30 for more information)

## 2019-11-22 NOTE — Telephone Encounter (Signed)
Left message to call back  

## 2019-11-23 ENCOUNTER — Telehealth: Payer: Self-pay | Admitting: Internal Medicine

## 2019-11-23 NOTE — Telephone Encounter (Signed)
Patient's wife calling back. She states the fax number she was given did not work. She states she sent it over again to [phone]. She states she would like a mychart message letting her know if it was received.

## 2019-11-23 NOTE — Telephone Encounter (Signed)
Medical Record's Received Paperwork from [place] for Dr. Rayann Heman to complete. Will call patient and inform him that we will need him to fill out a Release form and a $29 payment will need to be paid first.

## 2019-11-23 NOTE — Telephone Encounter (Signed)
Medical Record's Received Paperwork from [place] for Dr. Rayann Heman to complete. Will call patient and inform him that we will need him to fill out a Release form and a $29 payment will need to be paid first.

## 2019-11-23 NOTE — Telephone Encounter (Signed)
Spoke to [place] and let her know the fax number to send the fax to. Will have Dr. Rayann Heman fill out this form when he is in the office tomorrow and return to [place] in medical records to complete the process.

## 2019-11-24 ENCOUNTER — Telehealth: Payer: Self-pay | Admitting: Internal Medicine

## 2019-11-24 NOTE — Telephone Encounter (Signed)
Med Rec called patient L/M to call back about payment and signing Release Form-11/23/19. Received a call back from the Patients wife about having her husband sign Release Form and payment. Waiting for Patient to respond back. 11/24/19. jc

## 2019-11-24 NOTE — Telephone Encounter (Signed)
Paperwork completed by Dr. Rayann Heman and returned to [facility] in Medical Records

## 2019-11-27 NOTE — Telephone Encounter (Signed)
11/24/19 @ 520 pm - left a voicemail for Alexander James, patient's wife, to call me back on my direct line [phone])  11/27/19 @ 4:40pm - left a voicemail for Alexander James to call me back again. I informed her that we could waive the $29.00 fee this one time, however, we must have Alexander James complete the Release of Information prior to [place] releasing the information to anyone other than himself. I offered to email, fax or mail the ROI form in my voicemail. I will attempt to call her back again tomorrow.

## 2019-11-28 NOTE — Telephone Encounter (Signed)
11/28/19 @ 9:20am - left a voicemail for [employer]. I reiterated that we need to connect to get the ROI form signed by her husband if anyone other than himself is going to be the recipient of the completed form/paperwork. I let her know it is ready and he can pick it up or we can send it directly to him without the signed ROI on file.

## 2019-12-01 ENCOUNTER — Ambulatory Visit: Payer: Commercial Managed Care - PPO | Admitting: Cardiology

## 2019-12-08 ENCOUNTER — Ambulatory Visit (HOSPITAL_COMMUNITY)
Admission: RE | Admit: 2019-12-08 | Discharge: 2019-12-08 | Disposition: A | Discharge status: Home / Self Care | Payer: Commercial Managed Care - PPO | Source: Ambulatory Visit | Attending: Nurse Practitioner | Admitting: Nurse Practitioner

## 2019-12-08 ENCOUNTER — Other Ambulatory Visit: Payer: Self-pay

## 2019-12-08 ENCOUNTER — Encounter (HOSPITAL_COMMUNITY): Payer: Self-pay | Admitting: Nurse Practitioner

## 2019-12-08 VITALS — BP 140/84 | HR 79 | Ht 73.0 in | Wt 279.0 lb

## 2019-12-08 DIAGNOSIS — D6869 Other thrombophilia: Secondary | ICD-10-CM | POA: Diagnosis not present

## 2019-12-08 DIAGNOSIS — Z79899 Other long term (current) drug therapy: Secondary | ICD-10-CM | POA: Diagnosis not present

## 2019-12-08 DIAGNOSIS — Z7901 Long term (current) use of anticoagulants: Secondary | ICD-10-CM | POA: Diagnosis not present

## 2019-12-08 DIAGNOSIS — I10 Essential (primary) hypertension: Secondary | ICD-10-CM | POA: Insufficient documentation

## 2019-12-08 DIAGNOSIS — I480 Paroxysmal atrial fibrillation: Secondary | ICD-10-CM | POA: Diagnosis not present

## 2019-12-08 NOTE — Progress Notes (Signed)
Primary Care Physician: Alexander Sheriff, MD Referring Physician: Dr. Janyce James is a 39 y.o. male with a h/o HTN, paroxysmal atrial fibrillation that is in the afib clinic for f/u afib ablation 9/30. Pt had been on flecainide, Cardizem and xarelto prior to ablation and remains on these drugs. CHA2DS2VASc score is 1.  He denies any swallowing or groin issues today. EKG shows  NSR. He has not appreciated any afib.    Today, he denies symptoms of palpitations, chest pain, shortness of breath, orthopnea, PND, lower extremity edema, dizziness, presyncope, syncope, or neurologic sequela. The patient is tolerating medications without difficulties and is otherwise without complaint today.   Past Medical History:  Diagnosis Date  . Essential hypertension 06/09/2019  . Family history of malignant neoplasm of gastrointestinal tract 06/06/2013   Father with colon cancer age 34   . Family hx of colon cancer    father  . GERD (gastroesophageal reflux disease)   . Overweight   . Paroxysmal A-fib ([place]) 06/09/2019   Past Surgical History:  Procedure Laterality Date  . ATRIAL FIBRILLATION ABLATION N/A 11/09/2019   Procedure: ATRIAL FIBRILLATION ABLATION;  Surgeon: Alexander Grayer, MD;  Location: [facility];  Service: Cardiovascular;  Laterality: N/A;  . NO PAST SURGERIES      Current Outpatient Medications  Medication Sig Dispense Refill  . diltiazem (CARDIZEM CD) 240 MG 24 hr capsule Take 1 capsule (240 mg total) by mouth daily. 90 capsule 1  . esomeprazole (NEXIUM) 40 MG capsule Take 40 mg by mouth daily at 12 noon.    . flecainide (TAMBOCOR) 50 MG tablet Take 1 tablet (50 mg total) by mouth 2 (two) times daily. 180 tablet 1  . Multiple Vitamins-Minerals (MULTIVITAMIN GUMMIES ADULT PO) Take 2 tablets by mouth daily.    . rivaroxaban (XARELTO) 20 MG TABS tablet Take 1 tablet (20 mg total) by mouth daily with supper. 30 tablet 11   No current facility-administered medications  for this encounter.    No Known Allergies  Social History   Socioeconomic History  . Marital status: Married    Spouse name: Not on file  . Number of children: 2  . Years of education: Not on file  . Highest education level: Not on file  Occupational History  . Occupation: Scientist, research (physical sciences): [employer]  Tobacco Use  . Smoking status: Never Smoker  . Smokeless tobacco: Never Used  Substance and Sexual Activity  . Alcohol use: Yes    Comment: occasional beers,  several times per week  . Drug use: No  . Sexual activity: Not on file  Other Topics Concern  . 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 Difficulty of Paying Living Expenses: Not on file  Food Insecurity:   . Worried About Charity fundraiser in the Last Year: Not on file  . Ran Out of Food in the Last Year: Not on file  Transportation Needs:   . Lack of Transportation (Medical): Not on file  . Lack of Transportation (Non-Medical): Not on file  Physical Activity:   . Days of Exercise per Week: Not on file  . Minutes of Exercise per Session: Not on file  Stress:   . Feeling of Stress : Not on file  Social Connections:   . Frequency of Communication with Friends and Family: Not on file  . Frequency of Social Gatherings with Friends and Family: Not on file  . Attends Religious Services: Not on file  . Active Member of Clubs or Organizations: Not on file  . Attends Archivist Meetings: Not on file  . Marital Status: Not on file  Intimate Partner Violence:   . Fear of Current or Ex-Partner: Not on file  . Emotionally Abused: Not on file  . Physically Abused: Not on file  . Sexually Abused: Not on file    Family History  Problem Relation Age of Onset  . Colon cancer Father 81    ROS- All systems are reviewed and negative except as  per the HPI above  Physical Exam: There were no vitals filed for this visit. Wt Readings from Last 3 Encounters:  11/09/19 122.5 kg  07/03/19 117.9 kg  06/16/19 123.1 kg    Labs: Lab Results  Component Value Date   NA 140 10/12/2019   K 4.1 10/12/2019   CL 104 10/12/2019   CO2 21 10/12/2019   GLUCOSE 109 (H) 10/12/2019   BUN 11 10/12/2019   CREATININE 0.85 10/12/2019   CALCIUM 9.2 10/12/2019   No results found for: INR No results found for: CHOL, HDL, LDLCALC, TRIG   GEN- The patient is well appearing, alert and oriented x 3 today.   Head- normocephalic, atraumatic Eyes-  Sclera clear, conjunctiva pink Ears- hearing intact Oropharynx- clear Neck- supple, no JVP Lymph- no cervical lymphadenopathy Lungs- Clear to ausculation bilaterally, normal work of breathing Heart- Regular rate and rhythm, no murmurs, rubs or gallops, PMI not laterally displaced GI- soft, NT, ND, + BS Extremities- no clubbing, cyanosis, or edema MS- no significant deformity or atrophy Skin- no rash or lesion Psych- euthymic mood, full affect Neuro- strength and sensation are intact  EKG- NSR at 79 bpm, pr int 172 pbm, qrs int 96 ms, qtc 415 ms    Assessment and Plan: 1. Paroxysmal afib  He is doing well staying in SR since the procedure He has not noted any afib Continue  flecainide and CCB without change  2. CHA2DS2VASc score of 1(HTN)  Continue xarelto for the full 3 month recovery period post ablation   3. HTN  Stable   F/u with Dr. Rayann James 02/14/20  Alexander James. Alexander James, Alexander James [address] [place], [place] [address] [phone]

## 2020-02-06 ENCOUNTER — Other Ambulatory Visit: Payer: Self-pay | Admitting: Cardiology

## 2020-02-06 NOTE — Telephone Encounter (Signed)
Error- please disregard

## 2020-02-14 ENCOUNTER — Other Ambulatory Visit: Payer: Self-pay

## 2020-02-14 ENCOUNTER — Ambulatory Visit: Payer: Commercial Managed Care - PPO | Admitting: Internal Medicine

## 2020-02-14 ENCOUNTER — Encounter: Payer: Self-pay | Admitting: Internal Medicine

## 2020-02-14 VITALS — BP 132/86 | HR 73 | Ht 73.0 in | Wt 272.0 lb

## 2020-02-14 DIAGNOSIS — I10 Essential (primary) hypertension: Secondary | ICD-10-CM | POA: Diagnosis not present

## 2020-02-14 DIAGNOSIS — D6869 Other thrombophilia: Secondary | ICD-10-CM

## 2020-02-14 DIAGNOSIS — E669 Obesity, unspecified: Secondary | ICD-10-CM | POA: Diagnosis not present

## 2020-02-14 DIAGNOSIS — I480 Paroxysmal atrial fibrillation: Secondary | ICD-10-CM | POA: Diagnosis not present

## 2020-02-14 NOTE — Progress Notes (Signed)
   PCP: Noni Saupe, MD    Alexander James is a 40 y.o. male who presents today for routine electrophysiology followup.  Since his recent afib ablation, the patient reports doing very well.  he denies procedure related complications and is pleased with the results of the procedure.  Today, he denies symptoms of palpitations, chest pain, shortness of breath,  lower extremity edema, dizziness, presyncope, or syncope.  The patient is otherwise without complaint today.   Past Medical History:  Diagnosis Date  . Essential hypertension 06/09/2019  . Family history of malignant neoplasm of gastrointestinal tract 06/06/2013   Father with colon cancer age 59   . Family hx of colon cancer    father  . GERD (gastroesophageal reflux disease)   . Overweight   . Paroxysmal A-fib ([facility]) 06/09/2019   Past Surgical History:  Procedure Laterality Date  . ATRIAL FIBRILLATION ABLATION N/A 11/09/2019   Procedure: ATRIAL FIBRILLATION ABLATION;  Surgeon: Hillis Range, MD;  Location: [facility];  Service: Cardiovascular;  Laterality: N/A;  . NO PAST SURGERIES      ROS- all systems are personally reviewed and negatives except as per HPI above  Current Outpatient Medications  Medication Sig Dispense Refill  . esomeprazole (NEXIUM) 40 MG capsule Take 40 mg by mouth daily at 12 noon.    . flecainide (TAMBOCOR) 50 MG tablet TAKE ONE TABLET BY MOUTH 2 TIMES A DAY 180 tablet 1  . Multiple Vitamins-Minerals (MULTIVITAMIN GUMMIES ADULT PO) Take 2 tablets by mouth daily.    . rivaroxaban (XARELTO) 20 MG TABS tablet Take 1 tablet (20 mg total) by mouth daily with supper. 30 tablet 11  . diltiazem (CARDIZEM CD) 240 MG 24 hr capsule Take 1 capsule (240 mg total) by mouth daily. 90 capsule 1   No current facility-administered medications for this visit.    Physical Exam: Vitals:   02/14/20 1059  BP: 132/86  Pulse: 73  SpO2: 97%  Weight: 272 lb (123.4 kg)  Height: 6\' 1"  (1.854 m)    GEN- The  patient is well appearing, alert and oriented x 3 today.   Head- normocephalic, atraumatic Eyes-  Sclera clear, conjunctiva pink Ears- hearing intact Oropharynx- clear Lungs- Clear to ausculation bilaterally, normal work of breathing Heart- Regular rate and rhythm, no murmurs, rubs or gallops, PMI not laterally displaced GI- soft, NT, ND, + BS Extremities- no clubbing, cyanosis, or edema  EKG tracing ordered today is personally reviewed and shows sinus  Assessment and Plan:  1. Paroxysmal atrial fibrillation Doing well s/p ablation chads2vasc score is 1 He wishes to stop Xarelto at this time.   Stop flecainide Consider stopping diltiazem if no afib on return  2. HTN Stable No change required today  3. ETOH Avoidance advised  4. Obesity Body mass index is 35.89 kg/m. Lifestyle modification advised  Return to see me in 3 months  MD, [facility] 02/14/2020 11:28 AM

## 2020-02-14 NOTE — Addendum Note (Signed)
Addended by: Sheppard Evens E on: 02/14/2020 11:56 AM   Modules accepted: Orders

## 2020-02-14 NOTE — Patient Instructions (Addendum)
Medication Instructions:  Stop your Xarelto  Stop your Flecainide   *If you need a refill on your cardiac medications before your next appointment, please call your pharmacy*  Lab Work: None ordered.  If you have labs (blood work) drawn today and your tests are completely normal, you will receive your results only by: [name] MyChart Message (if you have MyChart) OR . A paper copy in the mail If you have any lab test that is abnormal or we need to change your treatment, we will call you to review the results.  Testing/Procedures: None ordered.  Follow-Up: At [facility], you and your health needs are our priority.  As part of our continuing mission to provide you with exceptional heart care, we have created designated Provider Care Teams.  These Care Teams include your primary Cardiologist (physician) and Advanced Practice Providers (APPs -  Physician Assistants and Nurse Practitioners) who all work together to provide you with the care you need, when you need it.  We recommend signing up for the patient portal called "MyChart".  Sign up information is provided on this After Visit Summary.  MyChart is used to connect with patients for Virtual Visits (Telemedicine).  Patients are able to view lab/test results, encounter notes, upcoming appointments, etc.  Non-urgent messages can be sent to your provider as well.   To learn more about what you can do with MyChart, go to [email or web address].    Your next appointment:   Your physician wants you to follow-up in: 05/13/20 at 9:45 am with Dr. Johney Frame.    Other Instructions:

## 2020-04-22 ENCOUNTER — Other Ambulatory Visit: Payer: Self-pay | Admitting: *Deleted

## 2020-04-22 ENCOUNTER — Ambulatory Visit (INDEPENDENT_AMBULATORY_CARE_PROVIDER_SITE_OTHER): Payer: Commercial Managed Care - PPO

## 2020-04-22 ENCOUNTER — Ambulatory Visit: Payer: Commercial Managed Care - PPO

## 2020-04-22 ENCOUNTER — Other Ambulatory Visit: Payer: Self-pay

## 2020-04-22 VITALS — BP 142/80 | HR 85 | Ht 73.0 in | Wt 272.0 lb

## 2020-04-22 DIAGNOSIS — I480 Paroxysmal atrial fibrillation: Secondary | ICD-10-CM | POA: Diagnosis not present

## 2020-04-22 NOTE — Progress Notes (Signed)
Pt came in today due to what felt like arrhythmia. Dr. Harriet Masson had asked pt to come in for 3 day zio but when he got here asked to have an ekg done. Ekg was done and shown to Dr. Bettina Gavia. Per Dr. Bettina Gavia EKG looks good. Will go forward with monitor

## 2020-04-26 ENCOUNTER — Encounter: Payer: Self-pay | Admitting: Cardiology

## 2020-04-26 ENCOUNTER — Ambulatory Visit: Payer: Commercial Managed Care - PPO | Admitting: Cardiology

## 2020-04-26 ENCOUNTER — Other Ambulatory Visit: Payer: Self-pay

## 2020-04-26 VITALS — BP 132/80 | HR 76 | Ht 73.0 in | Wt 267.0 lb

## 2020-04-26 DIAGNOSIS — I480 Paroxysmal atrial fibrillation: Secondary | ICD-10-CM

## 2020-04-26 DIAGNOSIS — R1011 Right upper quadrant pain: Secondary | ICD-10-CM

## 2020-04-26 DIAGNOSIS — E669 Obesity, unspecified: Secondary | ICD-10-CM | POA: Diagnosis not present

## 2020-04-26 DIAGNOSIS — I10 Essential (primary) hypertension: Secondary | ICD-10-CM | POA: Diagnosis not present

## 2020-04-26 DIAGNOSIS — Z6836 Body mass index (BMI) 36.0-36.9, adult: Secondary | ICD-10-CM | POA: Insufficient documentation

## 2020-04-26 NOTE — Patient Instructions (Signed)
Medication Instructions:  Your physician recommends that you continue on your current medications as directed. Please refer to the Current Medication list given to you today.  *If you need a refill on your cardiac medications before your next appointment, please call your pharmacy*   Lab Work: None If you have labs (blood work) drawn today and your tests are completely normal, you will receive your results only by: [name] MyChart Message (if you have MyChart) OR . A paper copy in the mail If you have any lab test that is abnormal or we need to change your treatment, we will call you to review the results.   Testing/Procedures: We have put in the order for you to have an abdominal ultrasound at [facility]. Once your insurance approves this test we will call you to schedule.    Follow-Up: At [facility], you and your health needs are our priority.  As part of our continuing mission to provide you with exceptional heart care, we have created designated Provider Care Teams.  These Care Teams include your primary Cardiologist (physician) and Advanced Practice Providers (APPs -  Physician Assistants and Nurse Practitioners) who all work together to provide you with the care you need, when you need it.  We recommend signing up for the patient portal called "MyChart".  Sign up information is provided on this After Visit Summary.  MyChart is used to connect with patients for Virtual Visits (Telemedicine).  Patients are able to view lab/test results, encounter notes, upcoming appointments, etc.  Non-urgent messages can be sent to your provider as well.   To learn more about what you can do with MyChart, go to [email or web address].    Your next appointment:   6 month(s)  The format for your next appointment:   In Person  Provider:   Berniece Salines, DO   Other Instructions

## 2020-04-26 NOTE — Progress Notes (Signed)
Cardiology Office Note:    Date:  04/26/2020   ID:  Alexander James, DOB [birth year], MRN [id number]  PCP:  [name], MD  Cardiologist:  Berniece Salines, DO  Electrophysiologist:  None   Referring MD: [name], MD   Chief Complaint  Patient presents with  . Follow-up    History of Present Illness:    Alexander James is a 40 y.o. male with a hx of paroxysmal atrial fibrillation he is status post ablation in September 2021 is now on Cardizem.  The patient presents as over the weekend he felt that he had been experiencing some intermittent palpitations.  He thought he was back in A. fib.  I recommended he come in and get a monitor placed on him which he presented on Monday for his ZIO monitor and this is still pending.  Today he tells me that feeling has improved.  He offers no other complaints at this time.  He does state that he did see his PCP on Monday at which time the blood work told him that was fine.  He also had abdominal pain and achiness.  He was worried that his gallbladder may be acting up.  His chiropractor did give him some diet suggestions to help quiet his gallbladder down and this has really helped since he started a diet.  No lightheadedness no dizziness. Past Medical History:  Diagnosis Date  . Essential hypertension 06/09/2019  . Family history of malignant neoplasm of gastrointestinal tract 06/06/2013   Father with colon cancer age 69   . Family hx of colon cancer    father  . GERD (gastroesophageal reflux disease)   . Overweight   . Paroxysmal A-fib ([place]) 06/09/2019    Past Surgical History:  Procedure Laterality Date  . ATRIAL FIBRILLATION ABLATION N/A 11/09/2019   Procedure: ATRIAL FIBRILLATION ABLATION;  Surgeon: Thompson Grayer, MD;  Location: [place] [facility];  Service: Cardiovascular;  Laterality: N/A;  . NO PAST SURGERIES      Current Medications: Current Meds  Medication Sig  . diltiazem (CARDIZEM CD) 240 MG 24 hr capsule Take 1  capsule (240 mg total) by mouth daily.  [name] esomeprazole (NEXIUM) 40 MG capsule Take 40 mg by mouth daily at 12 noon.  . Multiple Vitamins-Minerals (MULTIVITAMIN GUMMIES ADULT PO) Take 2 tablets by mouth daily.     Allergies:   Patient has no known allergies.   Social History   Socioeconomic History  . Marital status: Married    Spouse name: Not on file  . Number of children: 2  . Years of education: Not on file  . Highest education level: Not on file  Occupational History  . Occupation: Scientist, research (physical sciences): [employer]  Tobacco Use  . Smoking status: Never Smoker  . Smokeless tobacco: Never Used  Substance and Sexual Activity  . Alcohol use: Yes    Comment: occasional beers,  several times per week  . Drug use: No  . Sexual activity: Not on file  Other Topics Concern  . 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  ROS:   Review of Systems  Constitution: Negative for decreased appetite, fever and weight gain.  HENT: Negative for congestion, ear discharge, hoarse voice and sore throat.   Eyes: Negative for discharge, redness, vision loss in right eye and visual halos.  Cardiovascular: Negative for chest pain, dyspnea on exertion, leg swelling, orthopnea and palpitations.  Respiratory: Negative for cough, hemoptysis, shortness of breath and snoring.   Endocrine: Negative for heat intolerance and polyphagia.  Hematologic/Lymphatic: Negative for bleeding problem. Does not bruise/bleed easily.  Skin: Negative for flushing, nail changes, rash and  suspicious lesions.  Musculoskeletal: Negative for arthritis, joint pain, muscle cramps, myalgias, neck pain and stiffness.  Gastrointestinal: Negative for abdominal pain, bowel incontinence, diarrhea and excessive appetite.  Genitourinary: Negative for decreased libido, genital sores and incomplete emptying.  Neurological: Negative for brief paralysis, focal weakness, headaches and loss of balance.  Psychiatric/Behavioral: Negative for altered mental status, depression and suicidal ideas.  Allergic/Immunologic: Negative for HIV exposure and persistent infections.    EKGs/Labs/Other Studies Reviewed:    The following studies were reviewed today:   EKG:  The ekg ordered today demonstrates sinus rhythm, heart rate 76 bpm with nonspecific ST changes.  Recent Labs: 10/12/2019: BUN 11; Creatinine, Ser 0.85; Hemoglobin 15.3; Platelets 268; Potassium 4.1; Sodium 140  Recent Lipid Panel No results found for: CHOL, TRIG, HDL, CHOLHDL, VLDL, LDLCALC, LDLDIRECT  Physical Exam:    VS:  BP 132/80 (BP Location: Right Arm, Patient Position: Sitting, Cuff Size: Normal)   Pulse 76   Ht 6\' 1"  (1.854 m)   Wt 267 lb (121.1 kg)   SpO2 98%   BMI 35.23 kg/m     Wt Readings from Last 3 Encounters:  04/26/20 267 lb (121.1 kg)  04/22/20 272 lb (123.4 kg)  02/14/20 272 lb (123.4 kg)     GEN: Well nourished, well developed in no acute distress HEENT: Normal NECK: No JVD; No carotid bruits LYMPHATICS: No lymphadenopathy CARDIAC: S1S2 noted,RRR, no murmurs, rubs, gallops RESPIRATORY:  Clear to auscultation without rales, wheezing or rhonchi  ABDOMEN: Soft, non-tender, non-distended, +bowel sounds, no guarding. EXTREMITIES: No edema, No cyanosis, no clubbing MUSCULOSKELETAL:  No deformity  SKIN: Warm and dry NEUROLOGIC:  Alert and oriented x 3, non-focal PSYCHIATRIC:  Normal affect, good insight  ASSESSMENT:    1. Paroxysmal A-fib ([place])   2. Essential hypertension   3. Right upper quadrant  abdominal pain   4. Obesity (BMI 30-39.9)    PLAN:     He is in sinus rhythm today.  He has started back on taking his Cardizem.  I am waiting for his ZIO monitor report to make sure the patient is not going back in atrial fibrillation.  Blood pressure is acceptable, continue with current antihypertensive regimen.    The patient understands the need to lose weight with diet and exercise. We have discussed specific strategies for this.  He has right upper quadrant abdominal pain, [place] abdominal ultrasound to rule out any gallbladder pathology.  The patient is in agreement with the above plan. The patient left the office in stable condition.  The patient will follow up in 6 months or sooner if needed.   Medication Adjustments/Labs and Tests Ordered: Current medicines are reviewed at length with the patient today.  Concerns regarding medicines are outlined above.  Orders Placed This Encounter  Procedures  . EKG 12-Lead   No orders of  the defined types were placed in this encounter.   Patient Instructions  Medication Instructions:  Your physician recommends that you continue on your current medications as directed. Please refer to the Current Medication list given to you today.  *If you need a refill on your cardiac medications before your next appointment, please call your pharmacy*   Lab Work: None If you have labs (blood work) drawn today and your tests are completely normal, you will receive your results only by: [name] MyChart Message (if you have MyChart) OR . A paper copy in the mail If you have any lab test that is abnormal or we need to change your treatment, we will call you to review the results.   Testing/Procedures: We have put in the order for you to have an abdominal ultrasound at [facility]. Once your insurance approves this test we will call you to schedule.    Follow-Up: At [facility], you and your health needs are our priority.  As part of our  continuing mission to provide you with exceptional heart care, we have created designated Provider Care Teams.  These Care Teams include your primary Cardiologist (physician) and Advanced Practice Providers (APPs -  Physician Assistants and Nurse Practitioners) who all work together to provide you with the care you need, when you need it.  We recommend signing up for the patient portal called "MyChart".  Sign up information is provided on this After Visit Summary.  MyChart is used to connect with patients for Virtual Visits (Telemedicine).  Patients are able to view lab/test results, encounter notes, upcoming appointments, etc.  Non-urgent messages can be sent to your provider as well.   To learn more about what you can do with MyChart, go to [email or web address].    Your next appointment:   6 month(s)  The format for your next appointment:   In Person  Provider:   Berniece Salines, DO   Other Instructions      Adopting a Healthy Lifestyle.  Know what a healthy weight is for you (roughly BMI <25) and aim to maintain this   Aim for 7+ servings of fruits and vegetables daily   65-80+ fluid ounces of water or unsweet tea for healthy kidneys   Limit to max 1 drink of alcohol per day; avoid smoking/tobacco   Limit animal fats in diet for cholesterol and heart health - choose grass fed whenever available   Avoid highly processed foods, and foods high in saturated/trans fats   Aim for low stress - take time to unwind and care for your mental health   Aim for 150 min of moderate intensity exercise weekly for heart health, and weights twice weekly for bone health   Aim for 7-9 hours of sleep daily   When it comes to diets, agreement about the perfect plan isnt easy to find, even among the experts. Experts at the [employer] developed an idea known as the Healthy Eating Plate. Just imagine a plate divided into logical, healthy portions.   The emphasis is on diet  quality:   Load up on vegetables and fruits - one-half of your plate: Aim for color and variety, and remember that potatoes dont count.   Go for whole grains - one-quarter of your plate: Whole wheat, barley, wheat berries, quinoa, oats, brown rice, and foods made with them. If you want pasta, go with whole wheat pasta.   Protein power - one-quarter of your plate: Fish, chicken, beans, and nuts are  all healthy, versatile protein sources. Limit red meat.   The diet, however, does go beyond the plate, offering a few other suggestions.   Use healthy plant oils, such as olive, canola, soy, corn, sunflower and peanut. Check the labels, and avoid partially hydrogenated oil, which have unhealthy trans fats.   If youre thirsty, drink water. Coffee and tea are good in moderation, but skip sugary drinks and limit milk and dairy products to one or two daily servings.   The type of carbohydrate in the diet is more important than the amount. Some sources of carbohydrates, such as vegetables, fruits, whole grains, and beans-are healthier than others.   Finally, stay active  Signed, Berniece Salines, DO  04/26/2020 8:46 AM    [facility]

## 2020-04-26 NOTE — Addendum Note (Signed)
Addended by: Resa Miner I on: 04/26/2020 02:41 PM   Modules accepted: Orders

## 2020-05-13 ENCOUNTER — Ambulatory Visit: Payer: Commercial Managed Care - PPO | Admitting: Internal Medicine

## 2020-05-13 DIAGNOSIS — I10 Essential (primary) hypertension: Secondary | ICD-10-CM

## 2020-05-13 DIAGNOSIS — F1010 Alcohol abuse, uncomplicated: Secondary | ICD-10-CM

## 2020-05-13 DIAGNOSIS — I480 Paroxysmal atrial fibrillation: Secondary | ICD-10-CM

## 2020-05-23 MED ORDER — DILTIAZEM HCL ER COATED BEADS 240 MG PO CP24
240.0000 mg | ORAL_CAPSULE | Freq: Every day | ORAL | 3 refills | Status: DC
  Filled 2020-09-05: qty 90, 90d supply, fill #0

## 2020-06-21 ENCOUNTER — Other Ambulatory Visit: Payer: Self-pay

## 2020-06-21 ENCOUNTER — Ambulatory Visit: Payer: 59 | Admitting: Internal Medicine

## 2020-06-21 ENCOUNTER — Encounter: Payer: Self-pay | Admitting: Internal Medicine

## 2020-06-21 VITALS — BP 130/82 | HR 74 | Ht 73.0 in | Wt 268.0 lb

## 2020-06-21 DIAGNOSIS — F1010 Alcohol abuse, uncomplicated: Secondary | ICD-10-CM | POA: Diagnosis not present

## 2020-06-21 DIAGNOSIS — I10 Essential (primary) hypertension: Secondary | ICD-10-CM | POA: Diagnosis not present

## 2020-06-21 DIAGNOSIS — I480 Paroxysmal atrial fibrillation: Secondary | ICD-10-CM | POA: Diagnosis not present

## 2020-06-21 NOTE — Patient Instructions (Addendum)
Medication Instructions:  Your physician recommends that you continue on your current medications as directed. Please refer to the Current Medication list given to you today.  Labwork: None ordered.  Testing/Procedures: None ordered.  Follow-Up: Your physician wants you to follow-up in: 10/28/20 at 4 pm with Thompson Grayer, MD    Any Other Special Instructions Will Be Listed Below (If Applicable).  If you need a refill on your cardiac medications before your next appointment, please call your pharmacy.

## 2020-06-21 NOTE — Progress Notes (Signed)
   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  Since last being seen in our clinic, the patient reports doing very well.  He has rare palpitations.  These appear to be due primarily to PVCs.  Today, he denies symptoms of  chest pain, shortness of breath,  lower extremity edema, dizziness, presyncope, or syncope.  The patient is otherwise without complaint today.   Past Medical History:  Diagnosis Date  . Essential hypertension 06/09/2019  . Family history of malignant neoplasm of gastrointestinal tract 06/06/2013   Father with colon cancer age 32   . Family hx of colon cancer    father  . GERD (gastroesophageal reflux disease)   . Overweight   . Paroxysmal A-fib ([place]) 06/09/2019   Past Surgical History:  Procedure Laterality Date  . ATRIAL FIBRILLATION ABLATION N/A 11/09/2019   Procedure: ATRIAL FIBRILLATION ABLATION;  Surgeon: Thompson Grayer, MD;  Location: [place] [facility];  Service: Cardiovascular;  Laterality: N/A;  . NO PAST SURGERIES      ROS- all systems are reviewed and negatives except as per HPI above  Current Outpatient Medications  Medication Sig Dispense Refill  . diltiazem (CARDIZEM CD) 240 MG 24 hr capsule Take 1 capsule (240 mg total) by mouth daily. 90 capsule 3  . esomeprazole (NEXIUM) 40 MG capsule Take 40 mg by mouth daily at 12 noon.    . Multiple Vitamins-Minerals (MULTIVITAMIN GUMMIES ADULT PO) Take 2 tablets by mouth daily.     No current facility-administered medications for this visit.    Physical Exam: Vitals:   06/21/20 1505  BP: 130/82  Pulse: 74  SpO2: 96%  Weight: 268 lb (121.6 kg)  Height: 6\' 1"  (1.854 m)    GEN- The patient is well appearing, alert and oriented x 3 today.   Head- normocephalic, atraumatic Eyes-  Sclera clear, conjunctiva pink Ears- hearing intact Oropharynx- clear Lungs- Clear to  ausculation bilaterally, normal work of breathing Heart- Regular rate and rhythm, no murmurs, rubs or gallops, PMI not laterally displaced GI- soft, NT, ND, + BS Extremities- no clubbing, cyanosis, or edema Psych- anxious  Wt Readings from Last 3 Encounters:  06/21/20 268 lb (121.6 kg)  04/26/20 267 lb (121.1 kg)  04/22/20 272 lb (123.4 kg)   Zio 05/01/20 reviewed and shows no afib.  + PVCs (8%)  EKG tracing ordered today is personally reviewed and shows sinus with PVCs  Assessment and Plan:  1. Paroxysmal atrial fibrillation Doing well post ablation off flecainide chads2vasc score is 1.  Does not wish to be on [facility] currently.  This is supported by guidelines  2. HTN Stable No change required today  3. Obesity Body mass index is 35.36 kg/m. Lifestyle modification advised  4. ETOh Avoidance advised  5. PVCs We discussed at length today No changes at this time Avoiding high dose caffeine, ETOh, stress and working to increase exercise, fitness, and sleep were discussed  Risks, benefits and potential toxicities for medications prescribed and/or refilled reviewed with patient today.   Return in 4 months  Thompson Grayer MD, [facility] 06/21/2020 3:13 PM

## 2020-07-02 ENCOUNTER — Other Ambulatory Visit: Payer: Self-pay

## 2020-07-02 MED ORDER — ESOMEPRAZOLE MAGNESIUM 40 MG PO CPDR
40.0000 mg | DELAYED_RELEASE_CAPSULE | Freq: Every morning | ORAL | 3 refills | Status: DC
  Filled 2020-08-06: qty 90, 90d supply, fill #0
  Filled 2020-11-05: qty 90, 90d supply, fill #1

## 2020-07-11 DIAGNOSIS — I480 Paroxysmal atrial fibrillation: Secondary | ICD-10-CM

## 2020-07-11 MED ORDER — DILTIAZEM HCL ER COATED BEADS 240 MG PO CP24: 240.0000 mg | ORAL_CAPSULE | Freq: Every day | ORAL | 0 refills | Status: DC

## 2020-07-19 ENCOUNTER — Other Ambulatory Visit: Payer: Self-pay | Admitting: Internal Medicine

## 2020-07-19 NOTE — Telephone Encounter (Signed)
Pharmacy notified Rx completed per patient

## 2020-07-19 NOTE — Telephone Encounter (Signed)
This is a  pt, Dr. Tobb 

## 2020-08-06 ENCOUNTER — Other Ambulatory Visit: Payer: Self-pay

## 2020-08-06 DIAGNOSIS — F4323 Adjustment disorder with mixed anxiety and depressed mood: Secondary | ICD-10-CM | POA: Diagnosis not present

## 2020-08-30 DIAGNOSIS — L821 Other seborrheic keratosis: Secondary | ICD-10-CM | POA: Diagnosis not present

## 2020-08-30 DIAGNOSIS — L578 Other skin changes due to chronic exposure to nonionizing radiation: Secondary | ICD-10-CM | POA: Diagnosis not present

## 2020-09-04 DIAGNOSIS — F4323 Adjustment disorder with mixed anxiety and depressed mood: Secondary | ICD-10-CM | POA: Diagnosis not present

## 2020-09-05 ENCOUNTER — Other Ambulatory Visit: Payer: Self-pay

## 2020-10-28 ENCOUNTER — Other Ambulatory Visit: Payer: Self-pay

## 2020-10-28 ENCOUNTER — Ambulatory Visit: Payer: 59 | Admitting: Internal Medicine

## 2020-10-28 VITALS — BP 120/82 | HR 58 | Ht 73.0 in | Wt 261.0 lb

## 2020-10-28 DIAGNOSIS — F1010 Alcohol abuse, uncomplicated: Secondary | ICD-10-CM | POA: Diagnosis not present

## 2020-10-28 DIAGNOSIS — I10 Essential (primary) hypertension: Secondary | ICD-10-CM

## 2020-10-28 DIAGNOSIS — I480 Paroxysmal atrial fibrillation: Secondary | ICD-10-CM | POA: Diagnosis not present

## 2020-10-28 MED ORDER — DILTIAZEM HCL ER COATED BEADS 120 MG PO CP24
120.0000 mg | ORAL_CAPSULE | Freq: Every day | ORAL | 3 refills | Status: DC
  Filled 2020-10-28: qty 90, 90d supply, fill #0
  Filled 2021-02-24: qty 90, 90d supply, fill #1
  Filled 2021-05-19: qty 90, 90d supply, fill #2
  Filled 2021-08-04: qty 90, 90d supply, fill #3

## 2020-10-28 NOTE — Patient Instructions (Addendum)
Medication Instructions:  Reduce Diltiazem to 120 mg daily  Your physician recommends that you continue on your current medications as directed. Please refer to the Current Medication list given to you today.  Labwork: None ordered.  Testing/Procedures: None ordered.  Follow-Up: Your physician wants you to follow-up in: 12 months in the Afib Clinic. They will contact you to schedule.    Any Other Special Instructions Will Be Listed Below (If Applicable).  If you need a refill on your cardiac medications before your next appointment, please call your pharmacy.

## 2020-10-28 NOTE — Progress Notes (Signed)
   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 male who presents today for routine electrophysiology followup.  Since last being seen in our clinic, the patient reports doing very well.  Today, he denies symptoms of palpitations, chest pain, shortness of breath,  lower extremity edema, dizziness, presyncope, or syncope.  The patient is otherwise without complaint today.   Past Medical History:  Diagnosis Date   Essential hypertension 06/09/2019   Family history of malignant neoplasm of gastrointestinal tract 06/06/2013   Father with colon cancer age 77    Family hx of colon cancer    father   GERD (gastroesophageal reflux disease)    Overweight    Paroxysmal A-fib ([place]) 06/09/2019   Past Surgical History:  Procedure Laterality Date   ATRIAL FIBRILLATION ABLATION N/A 11/09/2019   Procedure: ATRIAL FIBRILLATION ABLATION;  Surgeon: Thompson Grayer, MD;  Location: [place] [facility];  Service: Cardiovascular;  Laterality: N/A;   NO PAST SURGERIES      ROS- all systems are reviewed and negatives except as per HPI above  Current Outpatient Medications  Medication Sig Dispense Refill   diltiazem (CARDIZEM CD) 240 MG 24 hr capsule Take 1 capsule (240 mg total) by mouth daily. 90 capsule 3   esomeprazole (NEXIUM) 40 MG capsule Take 40 mg by mouth daily at 12 noon.     esomeprazole (NEXIUM) 40 MG capsule Take 1 capsule (40 mg total) by mouth every morning. 90 capsule 3   Multiple Vitamins-Minerals (MULTIVITAMIN GUMMIES ADULT PO) Take 2 tablets by mouth daily.     No current facility-administered medications for this visit.    Physical Exam: Vitals:   10/28/20 1612  BP: 120/82  Pulse: (!) 58  SpO2: 95%  Weight: 261 lb (118.4 kg)  Height: 6\' 1"  (1.854 m)    GEN- The patient is well appearing, alert and oriented x 3 today.   Head- normocephalic, atraumatic Eyes-  Sclera clear, conjunctiva pink Ears- hearing  intact Oropharynx- clear Lungs- Clear to ausculation bilaterally, normal work of breathing Heart- Regular rate and rhythm, no murmurs, rubs or gallops, PMI not laterally displaced GI- soft, NT, ND, + BS Extremities- no clubbing, cyanosis, or edema  Wt Readings from Last 3 Encounters:  10/28/20 261 lb (118.4 kg)  06/21/20 268 lb (121.6 kg)  04/26/20 267 lb (121.1 kg)    EKG tracing ordered today is personally reviewed and shows sinus  Assessment and Plan:  Paroxysmal atrial fibrillation Well controlled post ablation Chads2vasc score is 1.  He does not require [place] currently Reduce diltiazem CD to 120mg  daily Consider stopping on return  2. Obesity Body mass index is 34.43 kg/m. Lifestyle modification advised  3. HTN Stable No change required today  4. PVCs Stable No change required today  5. ETOH Cessation advised  Follow-up in AF clinic in 12 months  Thompson Grayer MD, [facility] 10/28/2020 4:15 PM

## 2020-11-05 ENCOUNTER — Other Ambulatory Visit: Payer: Self-pay

## 2020-11-08 ENCOUNTER — Ambulatory Visit: Payer: Commercial Managed Care - PPO | Admitting: Cardiology

## 2021-01-20 ENCOUNTER — Other Ambulatory Visit: Payer: Self-pay

## 2021-01-20 MED ORDER — ESOMEPRAZOLE MAGNESIUM 40 MG PO CPDR
40.0000 mg | DELAYED_RELEASE_CAPSULE | Freq: Every morning | ORAL | 0 refills | Status: DC
Start: 2021-01-20 — End: 2021-05-15
  Filled 2021-01-20: qty 90, 90d supply, fill #0

## 2021-02-24 ENCOUNTER — Other Ambulatory Visit: Payer: Self-pay

## 2021-03-04 ENCOUNTER — Other Ambulatory Visit: Payer: Self-pay

## 2021-04-21 ENCOUNTER — Other Ambulatory Visit: Payer: Self-pay

## 2021-04-21 ENCOUNTER — Ambulatory Visit: Payer: 59 | Admitting: Gastroenterology

## 2021-04-21 DIAGNOSIS — Z800 Family history of malignant neoplasm of digestive organs: Secondary | ICD-10-CM

## 2021-04-21 MED ORDER — NA SULFATE-K SULFATE-MG SULF 17.5-3.13-1.6 GM/177ML PO SOLN
1.0000 | Freq: Once | ORAL | 0 refills | Status: AC
  Filled 2021-04-21: qty 354, 1d supply, fill #0

## 2021-04-21 NOTE — Progress Notes (Deleted)
Patient came in today because he was in need of colonoscopy due to a family history of colon cancer.  The patient's father had colon cancer at the age of 13.  The patient's first colonoscopy was at 30 and he was told to have a repeat colonoscopy every 5 years.  The patient will have his appointment canceled today and will be set up for a colonoscopy since he is aware that he needs a colonoscopy in he has no other issues at this time.  The patient has been explained the plan and agrees with it. ?

## 2021-04-22 ENCOUNTER — Other Ambulatory Visit: Payer: Self-pay

## 2021-04-24 ENCOUNTER — Other Ambulatory Visit: Payer: Self-pay

## 2021-04-24 MED ORDER — ESOMEPRAZOLE MAGNESIUM 40 MG PO CPDR
40.0000 mg | DELAYED_RELEASE_CAPSULE | Freq: Every morning | ORAL | 3 refills | Status: DC
  Filled 2021-04-24: qty 90, 90d supply, fill #0
  Filled 2021-07-27: qty 90, 90d supply, fill #1
  Filled 2021-10-20: qty 90, 90d supply, fill #2
  Filled 2022-01-20: qty 90, 90d supply, fill #3

## 2021-04-25 ENCOUNTER — Other Ambulatory Visit: Payer: Self-pay

## 2021-05-15 ENCOUNTER — Encounter: Payer: Self-pay | Admitting: Gastroenterology

## 2021-05-20 ENCOUNTER — Other Ambulatory Visit: Payer: Self-pay

## 2021-05-23 ENCOUNTER — Encounter: Payer: Self-pay | Admitting: Gastroenterology

## 2021-05-23 ENCOUNTER — Other Ambulatory Visit: Payer: Self-pay

## 2021-05-23 ENCOUNTER — Ambulatory Visit: Payer: 59 | Admitting: Anesthesiology

## 2021-05-23 ENCOUNTER — Ambulatory Visit
Admission: RE | Admit: 2021-05-23 | Discharge: 2021-05-23 | Disposition: A | Discharge status: Home / Self Care | Payer: 59 | Attending: Gastroenterology | Admitting: Gastroenterology

## 2021-05-23 ENCOUNTER — Encounter
Admission: RE | Disposition: A | Discharge status: Home / Self Care | Payer: Self-pay | Source: Home / Self Care | Attending: Gastroenterology

## 2021-05-23 ENCOUNTER — Other Ambulatory Visit
Admission: RE | Admit: 2021-05-23 | Discharge: 2021-05-23 | Disposition: A | Discharge status: Home / Self Care | Payer: 59 | Source: Home / Self Care | Attending: Family Medicine | Admitting: Family Medicine

## 2021-05-23 DIAGNOSIS — Z0000 Encounter for general adult medical examination without abnormal findings: Secondary | ICD-10-CM | POA: Diagnosis not present

## 2021-05-23 DIAGNOSIS — Z1211 Encounter for screening for malignant neoplasm of colon: Secondary | ICD-10-CM

## 2021-05-23 DIAGNOSIS — Z23 Encounter for immunization: Secondary | ICD-10-CM | POA: Diagnosis not present

## 2021-05-23 DIAGNOSIS — E559 Vitamin D deficiency, unspecified: Secondary | ICD-10-CM | POA: Insufficient documentation

## 2021-05-23 DIAGNOSIS — K648 Other hemorrhoids: Secondary | ICD-10-CM | POA: Diagnosis not present

## 2021-05-23 DIAGNOSIS — Z6834 Body mass index (BMI) 34.0-34.9, adult: Secondary | ICD-10-CM | POA: Diagnosis not present

## 2021-05-23 DIAGNOSIS — I4891 Unspecified atrial fibrillation: Secondary | ICD-10-CM | POA: Insufficient documentation

## 2021-05-23 DIAGNOSIS — Z1331 Encounter for screening for depression: Secondary | ICD-10-CM | POA: Diagnosis not present

## 2021-05-23 DIAGNOSIS — K219 Gastro-esophageal reflux disease without esophagitis: Secondary | ICD-10-CM | POA: Diagnosis not present

## 2021-05-23 DIAGNOSIS — I10 Essential (primary) hypertension: Secondary | ICD-10-CM | POA: Insufficient documentation

## 2021-05-23 DIAGNOSIS — Z800 Family history of malignant neoplasm of digestive organs: Secondary | ICD-10-CM | POA: Insufficient documentation

## 2021-05-23 DIAGNOSIS — K633 Ulcer of intestine: Secondary | ICD-10-CM | POA: Diagnosis not present

## 2021-05-23 HISTORY — PX: COLONOSCOPY: SHX5424

## 2021-05-23 LAB — COMPREHENSIVE METABOLIC PANEL
ALT: 28 U/L (ref 0–44)
AST: 24 U/L (ref 15–41)
Albumin: 4.4 g/dL (ref 3.5–5.0)
Alkaline Phosphatase: 78 U/L (ref 38–126)
Anion gap: 8 (ref 5–15)
BUN: 10 mg/dL (ref 6–20)
CO2: 23 mmol/L (ref 22–32)
Calcium: 9.2 mg/dL (ref 8.9–10.3)
Chloride: 103 mmol/L (ref 98–111)
Creatinine, Ser: 0.83 mg/dL (ref 0.61–1.24)
GFR, Estimated: >60 mL/min (ref >60)
Glucose, Bld: 96 mg/dL (ref 70–99)
Potassium: 4.3 mmol/L (ref 3.5–5.1)
Sodium: 134 mmol/L — ABNORMAL LOW (ref 135–145)
Total Bilirubin: 0.6 mg/dL (ref 0.3–1.2)
Total Protein: 7.7 g/dL (ref 6.5–8.1)

## 2021-05-23 LAB — CBC
HCT: 44.2 % (ref 39.0–52.0)
Hemoglobin: 15.5 g/dL (ref 13.0–17.0)
MCH: 30.9 pg (ref 26.0–34.0)
MCHC: 35.1 g/dL (ref 30.0–36.0)
MCV: 88.2 fL (ref 80.0–100.0)
Platelets: 262 K/uL (ref 150–400)
RBC: 5.01 MIL/uL (ref 4.22–5.81)
RDW: 12.7 % (ref 11.5–15.5)
WBC: 6.4 K/uL (ref 4.0–10.5)
nRBC: 0 % (ref 0.0–0.2)

## 2021-05-23 LAB — HEMOGLOBIN A1C
Hgb A1c MFr Bld: 5.5 % (ref 4.8–5.6)
Mean Plasma Glucose: 111.15 mg/dL

## 2021-05-23 LAB — URINALYSIS, ROUTINE W REFLEX MICROSCOPIC
Bilirubin Urine: NEGATIVE
Glucose, UA: NEGATIVE mg/dL
Hgb urine dipstick: NEGATIVE
Ketones, ur: NEGATIVE mg/dL
Leukocytes,Ua: NEGATIVE
Nitrite: NEGATIVE
Protein, ur: NEGATIVE mg/dL
Specific Gravity, Urine: >1.03 — ABNORMAL HIGH (ref 1.005–1.030)
pH: 5.5 (ref 5.0–8.0)

## 2021-05-23 LAB — VITAMIN D 25 HYDROXY (VIT D DEFICIENCY, FRACTURES): Vit D, 25-Hydroxy: 33.23 ng/mL (ref 30–100)

## 2021-05-23 LAB — LIPID PANEL
Cholesterol: 213 mg/dL — ABNORMAL HIGH (ref 0–200)
HDL: 43 mg/dL
LDL Cholesterol: 154 mg/dL — ABNORMAL HIGH (ref 0–99)
Total CHOL/HDL Ratio: 5 ratio
Triglycerides: 81 mg/dL
VLDL: 16 mg/dL (ref 0–40)

## 2021-05-23 LAB — TSH: TSH: 1.309 u[IU]/mL (ref 0.350–4.500)

## 2021-05-23 LAB — T4, FREE: Free T4: 0.91 ng/dL (ref 0.61–1.12)

## 2021-05-23 SURGERY — COLONOSCOPY
Anesthesia: General | Site: Rectum

## 2021-05-23 MED ORDER — ACETAMINOPHEN 160 MG/5ML PO SOLN: 325.0000 mg | Freq: Once | ORAL | Status: DC

## 2021-05-23 MED ORDER — STERILE WATER FOR IRRIGATION IR SOLN
Status: DC | PRN
  Administered 2021-05-23: 250 mL

## 2021-05-23 MED ORDER — STERILE WATER FOR IRRIGATION IR SOLN
Status: DC | PRN
Start: 2021-05-23 — End: 2021-05-23
  Administered 2021-05-23: 60 mL

## 2021-05-23 MED ORDER — LACTATED RINGERS IV SOLN: INTRAVENOUS | Status: DC

## 2021-05-23 MED ORDER — ACETAMINOPHEN 325 MG PO TABS: 325.0000 mg | ORAL_TABLET | Freq: Once | ORAL | Status: DC

## 2021-05-23 MED ORDER — SODIUM CHLORIDE 0.9 % IV SOLN: INTRAVENOUS | Status: DC

## 2021-05-23 MED ORDER — PROPOFOL 10 MG/ML IV BOLUS
INTRAVENOUS | Status: DC | PRN
  Administered 2021-05-23: 150 mg via INTRAVENOUS
  Administered 2021-05-23 (×3): 30 mg via INTRAVENOUS
  Administered 2021-05-23: 40 mg via INTRAVENOUS

## 2021-05-23 MED ORDER — LIDOCAINE HCL (CARDIAC) PF 100 MG/5ML IV SOSY
PREFILLED_SYRINGE | INTRAVENOUS | Status: DC | PRN
  Administered 2021-05-23: 30 mg via INTRAVENOUS

## 2021-05-23 SURGICAL SUPPLY — 22 items
CLIP HMST 235XBRD CATH ROT (MISCELLANEOUS) IMPLANT
CLIP RESOLUTION 360 11X235 (MISCELLANEOUS)
ELECT REM PT RETURN 9FT ADLT (ELECTROSURGICAL)
ELECTRODE REM PT RTRN 9FT ADLT (ELECTROSURGICAL) IMPLANT
FORCEPS BIOP RAD 4 LRG CAP 4 (CUTTING FORCEPS) ×1 IMPLANT
GOWN CVR UNV OPN BCK APRN NK (MISCELLANEOUS) ×2 IMPLANT
GOWN ISOL THUMB LOOP REG UNIV (MISCELLANEOUS) ×4
INJECTOR VARIJECT VIN23 (MISCELLANEOUS) IMPLANT
KIT DEFENDO VALVE AND CONN (KITS) IMPLANT
KIT PRC NS LF DISP ENDO (KITS) ×1 IMPLANT
KIT PROCEDURE OLYMPUS (KITS) ×2
MANIFOLD NEPTUNE II (INSTRUMENTS) ×2 IMPLANT
MARKER SPOT ENDO TATTOO 5ML (MISCELLANEOUS) IMPLANT
PROBE APC STR FIRE (PROBE) IMPLANT
RETRIEVER NET ROTH 2.5X230 LF (MISCELLANEOUS) IMPLANT
SNARE COLD EXACTO (MISCELLANEOUS) IMPLANT
SNARE SHORT THROW 13M SML OVAL (MISCELLANEOUS) IMPLANT
SNARE SNG USE RND 15MM (INSTRUMENTS) IMPLANT
SPOT EX ENDOSCOPIC TATTOO (MISCELLANEOUS)
TRAP ETRAP POLY (MISCELLANEOUS) IMPLANT
VARIJECT INJECTOR VIN23 (MISCELLANEOUS)
WATER STERILE IRR 250ML POUR (IV SOLUTION) ×2 IMPLANT

## 2021-05-23 NOTE — Anesthesia Procedure Notes (Signed)
Date/Time: 05/23/2021 10:10 AM ?Performed by: Cameron Ali, CRNA ?Pre-anesthesia Checklist: Patient identified, Emergency Drugs available, Suction available, Timeout performed and Patient being monitored ?Patient Re-evaluated:Patient Re-evaluated prior to induction ?Oxygen Delivery Method: Nasal cannula ?Placement Confirmation: positive ETCO2 ? ? ? ? ?

## 2021-05-23 NOTE — Anesthesia Preprocedure Evaluation (Signed)
Anesthesia Evaluation  ?Patient identified by MRN, date of birth, ID band ?Patient awake ? ? ? ?Reviewed: ?Allergy & Precautions, H&P , NPO status , Patient's Chart, lab work & pertinent test results ? ?Airway ?Mallampati: II ? ?TM Distance: >3 FB ?Neck ROM: full ? ? ? Dental ?no notable dental hx. ? ?  ?Pulmonary ? ?  ?Pulmonary exam normal ?breath sounds clear to auscultation ? ? ? ? ? ? Cardiovascular ?hypertension, Normal cardiovascular exam ?Rhythm:regular Rate:Normal ? ?H/O A-Fib, s/p ablation ?  ?Neuro/Psych ?  ? GI/Hepatic ?GERD  ,  ?Endo/Other  ? ? Renal/GU ?  ? ?  ?Musculoskeletal ? ? Abdominal ?  ?Peds ? Hematology ?  ?Anesthesia Other Findings ? ? Reproductive/Obstetrics ? ?  ? ? ? ? ? ? ? ? ? ? ? ? ? ?  ?  ? ? ? ? ? ? ? ? ?Anesthesia Physical ?Anesthesia Plan ? ?ASA: 2 ? ?Anesthesia Plan: General  ? ?Post-op Pain Management: Minimal or no pain anticipated  ? ?Induction: Intravenous ? ?PONV Risk Score and Plan: 2 and Treatment may vary due to age or medical condition, TIVA and Propofol infusion ? ?Airway Management Planned: Natural Airway ? ?Additional Equipment:  ? ?Intra-op Plan:  ? ?Post-operative Plan:  ? ?Informed Consent: I have reviewed the patients History and Physical, chart, labs and discussed the procedure including the risks, benefits and alternatives for the proposed anesthesia with the patient or authorized representative who has indicated his/her understanding and acceptance.  ? ? ? ?Dental Advisory Given ? ?Plan Discussed with: CRNA ? ?Anesthesia Plan Comments:   ? ? ? ? ? ? ?Anesthesia Quick Evaluation ? ?

## 2021-05-23 NOTE — H&P (Signed)
? ?[name], MD [facility] ?[address] ?[place], [place] [address] ?Phone: [phone] ?Fax : [phone] ? ?Primary Care Physician:  [name], MD ?Primary Gastroenterologist:  Dr. Allen Norris ? ?Pre-Procedure History & Physical: ?HPI:  Alexander James is a 41 y.o. male is here for a screening colonoscopy.  ? ?Past Medical History:  ?Diagnosis Date  ? Essential hypertension 06/09/2019  ? Family history of malignant neoplasm of gastrointestinal tract 06/06/2013  ? Father with colon cancer age 40   ? Family hx of colon cancer   ? father  ? GERD (gastroesophageal reflux disease)   ? Overweight   ? Paroxysmal A-fib ([place]) 06/09/2019  ? ? ?Past Surgical History:  ?Procedure Laterality Date  ? ATRIAL FIBRILLATION ABLATION N/A 11/09/2019  ? Procedure: ATRIAL FIBRILLATION ABLATION;  Surgeon: Thompson Grayer, MD;  Location: [place] [facility];  Service: Cardiovascular;  Laterality: N/A;  ? ? ?Prior to Admission medications   ?Medication Sig Start Date End Date Taking? Authorizing Provider  ?diltiazem (CARDIZEM CD) 120 MG 24 hr capsule Take 1 capsule (120 mg total) by mouth daily. 10/28/20  Yes Allred, Jeneen Rinks, MD  ?esomeprazole (NEXIUM) 40 MG capsule Take 1 capsule (40 mg total) by mouth every morning. 04/24/21  Yes   ?Multiple Vitamins-Minerals (MULTIVITAMIN GUMMIES ADULT PO) Take 2 tablets by mouth daily.   Yes [provider]  ? ? ?Allergies as of 04/21/2021  ? (No Known Allergies)  ? ? ?Family History  ?Problem Relation Age of Onset  ? Colon cancer Father 60  ? ? ?Social History  ? ?Socioeconomic History  ? Marital status: Married  ?  Spouse name: Not on file  ? Number of children: 2  ? Years of education: Not on file  ? Highest education level: Not on file  ?Occupational History  ? Occupation: Self-employed  ?  Employer: [employer]  ?Tobacco Use  ? Smoking status: Never  ? Smokeless tobacco: Never  ?Vaping Use  ? Vaping Use: Never used  ?Substance and Sexual Activity  ? Alcohol use: Yes  ?   Alcohol/week: 10.0 standard drinks  ?  Types: 10 Standard drinks or equivalent per week  ?  Comment: occasional beers,  several times per week  ? Drug use: No  ? Sexual activity: Not on file  ?Other Topics Concern  ? Not on file  ?Social History Narrative  ? Lives in [place] with spouse and 2 daughters  ? Owns a hauling and grading business  ? ?Social Determinants of Health  ? ?Financial Resource Strain: Not on file  ?Food Insecurity: Not on file  ?Transportation Needs: Not on file  ?Physical Activity: Not on file  ?Stress: Not on file  ?Social Connections: Not on file  ?Intimate Partner Violence: Not on file  ? ? ?Review of Systems: ?See HPI, otherwise negative ROS ? ?Physical Exam: ?BP 133/88   Pulse (!) 58   Temp 97.9 ?F (36.6 ?C) (Temporal)   Ht '6\' 1"'$  (1.854 m)   Wt 118.1 kg   SpO2 97%   BMI 34.36 kg/m?  ?General:   Alert,  pleasant and cooperative in NAD ?Head:  Normocephalic and atraumatic. ?Neck:  Supple; no masses or thyromegaly. ?Lungs:  Clear throughout to auscultation.    ?Heart:  Regular rate and rhythm. ?Abdomen:  Soft, nontender and nondistended. Normal bowel sounds, without guarding, and without rebound.   ?Neurologic:  Alert and  oriented x4;  grossly normal neurologically. ? ?Impression/Plan: ?MARKICE TORBERT is  now here to undergo a screening colonoscopy. ? ?Risks, benefits, and alternatives regarding colonoscopy have been reviewed with the patient.  Questions have been answered.  All parties agreeable. ?

## 2021-05-23 NOTE — Transfer of Care (Signed)
Immediate Anesthesia Transfer of Care Note ? ?Patient: Alexander James ? ?Procedure(s) Performed: COLONOSCOPY (Rectum) ? ?Patient Location: PACU ? ?Anesthesia Type: General ? ?Level of Consciousness: awake, alert  and patient cooperative ? ?Airway and Oxygen Therapy: Patient Spontanous Breathing and Patient connected to supplemental oxygen ? ?Post-op Assessment: Post-op Vital signs reviewed, Patient's Cardiovascular Status Stable, Respiratory Function Stable, Patent Airway and No signs of Nausea or vomiting ? ?Post-op Vital Signs: Reviewed and stable ? ?Complications: No notable events documented. ? ?

## 2021-05-23 NOTE — Anesthesia Postprocedure Evaluation (Signed)
Anesthesia Post Note ? ?Patient: Alexander James ? ?Procedure(s) Performed: COLONOSCOPY (Rectum) ? ? ?  ?Patient location during evaluation: PACU ?Anesthesia Type: General ?Level of consciousness: awake and alert and oriented ?Pain management: satisfactory to patient ?Vital Signs Assessment: post-procedure vital signs reviewed and stable ?Respiratory status: spontaneous breathing, nonlabored ventilation and respiratory function stable ?Cardiovascular status: blood pressure returned to baseline and stable ?Postop Assessment: Adequate PO intake and No signs of nausea or vomiting ?Anesthetic complications: no ? ? ?No notable events documented. ? ?Raliegh Ip ? ? ? ? ? ?

## 2021-05-23 NOTE — Op Note (Signed)
[facility] ?Gastroenterology ?Patient Name: Alexander James ?Procedure Date: 05/23/2021 10:01 AM ?MRN: [id number] ?Account #: [id number] ?Date of Birth: 1980-02-28 ?Admit Type: Outpatient ?Age: 41 ?Room: [facility] OR ROOM 01 ?Gender: Male ?Note Status: Finalized ?Instrument Name: [id number] ?Procedure:             Colonoscopy ?Indications:           Screening for colorectal malignant neoplasm, Family  ?                       history of colon cancer in a first-degree relative  ?                       before age 40 years ?Providers:             [name] MD, MD ?Referring MD:          [name] (Referring MD) ?Medicines:             Propofol per Anesthesia ?Complications:         No immediate complications. ?Procedure:             Pre-Anesthesia Assessment: ?                       - Prior to the procedure, a History and Physical was  ?                       performed, and patient medications and allergies were  ?                       reviewed. The patient's tolerance of previous  ?                       anesthesia was also reviewed. The risks and benefits  ?                       of the procedure and the sedation options and risks  ?                       were discussed with the patient. All questions were  ?                       answered, and informed consent was obtained. Prior  ?                       Anticoagulants: The patient has taken no previous  ?                       anticoagulant or antiplatelet agents. ASA Grade  ?                       Assessment: II - A patient with mild systemic disease.  ?                       After reviewing the risks and benefits, the patient  ?                       was deemed in satisfactory condition to undergo the  ?  procedure. ?                       After obtaining informed consent, the colonoscope was  ?                       passed under direct vision. Throughout the procedure,  ?                       the patient's blood pressure,  pulse, and oxygen  ?                       saturations were monitored continuously. The  ?                       Colonoscope was introduced through the anus and  ?                       advanced to the the cecum, identified by appendiceal  ?                       orifice and ileocecal valve. The colonoscopy was  ?                       performed without difficulty. The patient tolerated  ?                       the procedure well. The quality of the bowel  ?                       preparation was excellent. ?Findings: ?     The perianal and digital rectal examinations were normal. ?     Nonbleeding ulcerated mucosa with no stigmata of recent bleeding were  ?     present in the transverse colon. Biopsies were taken with a cold forceps  ?     for histology. ?     Non-bleeding internal hemorrhoids were found during retroflexion. The  ?     hemorrhoids were Grade I (internal hemorrhoids that do not prolapse). ?Impression:            - Mucosal ulceration. Biopsied. ?                       - Non-bleeding internal hemorrhoids. ?Recommendation:        - Discharge patient to home. ?                       - Resume previous diet. ?                       - Continue present medications. ?                       - Await pathology results. ?                       - Repeat colonoscopy in 5 years for surveillance. ?Procedure Code(s):     --- Professional --- ?                       [phone], Colonoscopy, flexible; with biopsy, single or  ?  multiple ?Diagnosis Code(s):     --- Professional --- ?                       Z12.11, Encounter for screening for malignant neoplasm  ?                       of colon ?                       Z80.0, Family history of malignant neoplasm of  ?                       digestive organs ?CPT copyright 2019 American Medical Association. All rights reserved. ?The codes documented in this report are preliminary and upon coder review may  ?be revised to meet current compliance  requirements. ?[name] MD, MD ?05/23/2021 10:24:54 AM ?This report has been signed electronically. ?Number of Addenda: 0 ?Note Initiated On: 05/23/2021 10:01 AM ?Scope Withdrawal Time: 0 hours 5 minutes 55 seconds  ?Total Procedure Duration: 0 hours 9 minutes 21 seconds  ?Estimated Blood Loss:  Estimated blood loss: none. Estimated blood loss: none. ?     [facility] ?

## 2021-05-26 ENCOUNTER — Encounter: Payer: Self-pay | Admitting: Gastroenterology

## 2021-05-27 LAB — SURGICAL PATHOLOGY

## 2021-05-30 ENCOUNTER — Encounter: Payer: Self-pay | Admitting: Gastroenterology

## 2021-06-24 ENCOUNTER — Other Ambulatory Visit: Payer: Self-pay

## 2021-06-24 MED ORDER — WEGOVY 0.25 MG/0.5ML ~~LOC~~ SOAJ
SUBCUTANEOUS | 0 refills | Status: DC
  Filled 2021-06-24 – 2021-06-27 (×3): qty 2, 28d supply, fill #0

## 2021-06-26 ENCOUNTER — Other Ambulatory Visit: Payer: Self-pay

## 2021-06-27 ENCOUNTER — Other Ambulatory Visit: Payer: Self-pay

## 2021-07-01 ENCOUNTER — Other Ambulatory Visit: Payer: Self-pay

## 2021-07-14 ENCOUNTER — Other Ambulatory Visit: Payer: Self-pay

## 2021-07-15 ENCOUNTER — Other Ambulatory Visit: Payer: Self-pay

## 2021-07-15 ENCOUNTER — Encounter: Payer: Self-pay | Admitting: Pharmacist

## 2021-07-15 MED ORDER — WEGOVY 0.5 MG/0.5ML ~~LOC~~ SOAJ
SUBCUTANEOUS | 0 refills | Status: DC
  Filled 2021-07-25: qty 2, 28d supply, fill #0

## 2021-07-16 ENCOUNTER — Other Ambulatory Visit: Payer: Self-pay

## 2021-07-17 ENCOUNTER — Other Ambulatory Visit: Payer: Self-pay

## 2021-07-18 ENCOUNTER — Other Ambulatory Visit (HOSPITAL_COMMUNITY): Payer: Self-pay

## 2021-07-18 MED ORDER — SEMAGLUTIDE-WEIGHT MANAGEMENT 0.5 MG/0.5ML ~~LOC~~ SOAJ
SUBCUTANEOUS | 0 refills | Status: DC
  Filled 2021-07-18 – 2021-08-13 (×2): qty 2, 28d supply, fill #0

## 2021-07-21 ENCOUNTER — Other Ambulatory Visit (HOSPITAL_COMMUNITY): Payer: Self-pay

## 2021-07-25 ENCOUNTER — Other Ambulatory Visit (HOSPITAL_COMMUNITY): Payer: Self-pay

## 2021-07-25 ENCOUNTER — Other Ambulatory Visit: Payer: Self-pay

## 2021-07-26 ENCOUNTER — Other Ambulatory Visit (HOSPITAL_COMMUNITY): Payer: Self-pay

## 2021-07-26 MED ORDER — WEGOVY 0.25 MG/0.5ML ~~LOC~~ SOAJ
SUBCUTANEOUS | 0 refills | Status: DC
  Filled 2021-12-30: qty 2, 28d supply, fill #0

## 2021-07-27 ENCOUNTER — Other Ambulatory Visit: Payer: Self-pay

## 2021-07-28 ENCOUNTER — Other Ambulatory Visit (HOSPITAL_COMMUNITY): Payer: Self-pay

## 2021-07-28 ENCOUNTER — Other Ambulatory Visit: Payer: Self-pay

## 2021-08-04 ENCOUNTER — Other Ambulatory Visit: Payer: Self-pay

## 2021-08-13 ENCOUNTER — Other Ambulatory Visit: Payer: Self-pay

## 2021-08-18 ENCOUNTER — Other Ambulatory Visit: Payer: Self-pay

## 2021-08-26 ENCOUNTER — Other Ambulatory Visit: Payer: Self-pay

## 2021-08-26 MED ORDER — WEGOVY 1 MG/0.5ML ~~LOC~~ SOAJ
SUBCUTANEOUS | 0 refills | Status: DC
  Filled 2021-08-26: qty 2, 28d supply, fill #0

## 2021-08-28 DIAGNOSIS — D485 Neoplasm of uncertain behavior of skin: Secondary | ICD-10-CM | POA: Diagnosis not present

## 2021-08-28 DIAGNOSIS — D225 Melanocytic nevi of trunk: Secondary | ICD-10-CM | POA: Diagnosis not present

## 2021-08-28 DIAGNOSIS — L578 Other skin changes due to chronic exposure to nonionizing radiation: Secondary | ICD-10-CM | POA: Diagnosis not present

## 2021-09-18 ENCOUNTER — Other Ambulatory Visit: Payer: Self-pay

## 2021-09-18 MED ORDER — WEGOVY 1.7 MG/0.75ML ~~LOC~~ SOAJ
SUBCUTANEOUS | 1 refills | Status: DC
  Filled 2021-09-18: qty 3, 28d supply, fill #0
  Filled 2021-12-30: qty 3, 28d supply, fill #1

## 2021-10-16 ENCOUNTER — Other Ambulatory Visit: Payer: Self-pay

## 2021-10-16 MED ORDER — SEMAGLUTIDE-WEIGHT MANAGEMENT 2.4 MG/0.75ML ~~LOC~~ SOAJ
SUBCUTANEOUS | 2 refills | Status: DC
  Filled 2021-10-16 – 2021-10-17 (×2): qty 3, 28d supply, fill #0
  Filled 2021-11-13: qty 3, 28d supply, fill #1
  Filled 2021-12-30 – 2022-01-05 (×3): qty 3, 28d supply, fill #2

## 2021-10-17 ENCOUNTER — Other Ambulatory Visit: Payer: Self-pay

## 2021-10-20 ENCOUNTER — Other Ambulatory Visit: Payer: Self-pay | Admitting: Internal Medicine

## 2021-10-20 ENCOUNTER — Other Ambulatory Visit: Payer: Self-pay

## 2021-10-20 MED ORDER — DILTIAZEM HCL ER COATED BEADS 120 MG PO CP24
120.0000 mg | ORAL_CAPSULE | Freq: Every day | ORAL | 0 refills | Status: DC
  Filled 2021-10-20: qty 30, 30d supply, fill #0

## 2021-11-14 ENCOUNTER — Other Ambulatory Visit: Payer: Self-pay

## 2021-11-14 ENCOUNTER — Ambulatory Visit (HOSPITAL_COMMUNITY)
Admission: RE | Admit: 2021-11-14 | Discharge: 2021-11-14 | Disposition: A | Discharge status: Home / Self Care | Payer: 59 | Source: Ambulatory Visit | Attending: Physician Assistant | Admitting: Physician Assistant

## 2021-11-14 ENCOUNTER — Other Ambulatory Visit (HOSPITAL_COMMUNITY): Payer: Self-pay

## 2021-11-14 VITALS — BP 140/90 | HR 74 | Wt 253.6 lb

## 2021-11-14 DIAGNOSIS — I10 Essential (primary) hypertension: Secondary | ICD-10-CM | POA: Diagnosis not present

## 2021-11-14 DIAGNOSIS — Z79899 Other long term (current) drug therapy: Secondary | ICD-10-CM | POA: Diagnosis not present

## 2021-11-14 DIAGNOSIS — I480 Paroxysmal atrial fibrillation: Secondary | ICD-10-CM | POA: Diagnosis not present

## 2021-11-14 MED ORDER — DILTIAZEM HCL ER COATED BEADS 120 MG PO CP24
120.0000 mg | ORAL_CAPSULE | Freq: Every day | ORAL | 3 refills | Status: DC
  Filled 2021-11-14 – 2021-12-03 (×3): qty 90, 90d supply, fill #0
  Filled 2022-01-02 – 2022-02-25 (×4): qty 90, 90d supply, fill #1
  Filled 2022-05-28: qty 90, 90d supply, fill #2
  Filled 2022-08-18: qty 90, 90d supply, fill #3

## 2021-11-14 NOTE — Progress Notes (Signed)
Primary Care Physician: [name], MD Referring Physician: Dr. Janyce Llanos is a 41 y.o. male with a h/o HTN, paroxysmal atrial fibrillation who presents for follow up in the [facility]. Patient has a CHA2DS2VASc score is 1. His is s/p afib ablation 11/09/19. He maintained SR and his flecainide was discontinued.   On follow up today, patient reports that he has done well since his last visit. He has had only two episodes of palpitations, each lasting less than one minute. He is not currently on anticoagulation.   Today, he denies symptoms of palpitations, chest pain, shortness of breath, orthopnea, PND, lower extremity edema, dizziness, presyncope, syncope, or neurologic sequela. The patient is tolerating medications without difficulties and is otherwise without complaint today.   Past Medical History:  Diagnosis Date   Essential hypertension 06/09/2019   Family history of malignant neoplasm of gastrointestinal tract 06/06/2013   Father with colon cancer age 55    Family hx of colon cancer    father   GERD (gastroesophageal reflux disease)    Overweight    Paroxysmal A-fib ([place]) 06/09/2019   Past Surgical History:  Procedure Laterality Date   ATRIAL FIBRILLATION ABLATION N/A 11/09/2019   Procedure: ATRIAL FIBRILLATION ABLATION;  Surgeon: Thompson Grayer, MD;  Location: [place] [facility];  Service: Cardiovascular;  Laterality: N/A;   COLONOSCOPY N/A 05/23/2021   Procedure: COLONOSCOPY;  Surgeon: [name], MD;  Location: [facility];  Service: Endoscopy;  Laterality: N/A;    Current Outpatient Medications  Medication Sig Dispense Refill   esomeprazole (NEXIUM) 40 MG capsule Take 1 capsule (40 mg total) by mouth every morning. 90 capsule 3   Multiple Vitamins-Minerals (MULTIVITAMIN GUMMIES ADULT PO) Take 2 tablets by mouth daily.     Semaglutide-Weight Management (WEGOVY) 0.25 MG/0.5ML SOAJ 0.5 Milliliter under skin weekly 2  mL 0   Semaglutide-Weight Management (WEGOVY) 0.5 MG/0.5ML SOAJ Inject 0.'5mg'$  (1 pen) under the skin once a week 2 mL 0   Semaglutide-Weight Management (WEGOVY) 1 MG/0.5ML SOAJ 1 (one) Pre-filled Pen Syringe under skin weekly 2 mL 0   Semaglutide-Weight Management (WEGOVY) 1.7 MG/0.75ML SOAJ 1 (one) pen under skin weekly 3 mL 1   Semaglutide-Weight Management 0.5 MG/0.5ML SOAJ Inject 1 pen (0.5 MG) into the skin once a week 2 mL 0   Semaglutide-Weight Management 2.4 MG/0.75ML SOAJ INJECT CONTENTS OF ONE WEGOVY PEN UNDER THE SKIN ONCE A WEEK. 3 mL 2   diltiazem (CARTIA XT) 120 MG 24 hr capsule Take 1 capsule (120 mg total) by mouth daily. 90 capsule 3   No current facility-administered medications for this encounter.    No Known Allergies  Social History   Socioeconomic History   Marital status: Married    Spouse name: Not on file   Number of children: 2   Years of education: Not on file   Highest education level: Not on file  Occupational History   Occupation: Self-employed    Employer: [employer]  Tobacco Use   Smoking status: Never   Smokeless tobacco: Never  Vaping Use   Vaping Use: Never used  Substance and Sexual Activity   Alcohol use: Yes    Alcohol/week: 10.0 standard drinks of alcohol    Types: 10 Standard drinks or equivalent per week    Comment: occasional beers,  several times per week   Drug use: No   Sexual activity: Not on file  Other Topics Concern  Not on file  Social History Narrative   Lives in [place] with spouse and 2 daughters   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   Social Determinants of Health   Financial Resource Strain: Not on file  Food Insecurity: Not on file  Transportation Needs: Not on file  Physical Activity: Not on file  Stress: Not on file  Social Connections: Not on file  Intimate Partner Violence: Not on file    Family History  Problem Relation Age of Onset   Colon cancer Father 36    ROS- All systems are  reviewed and negative except as per the HPI above  Physical Exam: Vitals:   11/14/21 1130  BP: (!) 140/90  Pulse: 74  Weight: 115 kg   Wt Readings from Last 3 Encounters:  11/14/21 115 kg  05/23/21 118.1 kg  10/28/20 118.4 kg    Labs: Lab Results  Component Value Date   NA 134 (L) 05/23/2021   K 4.3 05/23/2021   CL 103 05/23/2021   CO2 23 05/23/2021   GLUCOSE 96 05/23/2021   BUN 10 05/23/2021   CREATININE 0.83 05/23/2021   CALCIUM 9.2 05/23/2021   No results found for: "INR" Lab Results  Component Value Date   CHOL 213 (H) 05/23/2021   HDL 43 05/23/2021   LDLCALC 154 (H) 05/23/2021   TRIG 81 05/23/2021    GEN- The patient is a well appearing male, alert and oriented x 3 today.   HEENT-head normocephalic, atraumatic, sclera clear, conjunctiva pink, hearing intact, trachea midline. Lungs- Clear to ausculation bilaterally, normal work of breathing Heart- Regular rate and rhythm, no murmurs, rubs or gallops  GI- soft, NT, ND, + BS Extremities- no clubbing, cyanosis, or edema MS- no significant deformity or atrophy Skin- no rash or lesion Psych- euthymic mood, full affect Neuro- strength and sensation are intact   EKG-  SR Vent. rate 74 BPM PR interval 152 ms QRS duration 98 ms QT/QTcB 388/430 ms   Assessment and Plan: 1. Paroxysmal afib  S/p afib ablation 2021 Patient appears to be maintaining SR. Not currently on anticoagulation with a low CV score of 1.  Continue diltiazem 120 mg daily  2. HTN  Stable, no changes today.   Follow up in the AF clinic in one year.    [facility] [address] [place], [place] [address] [phone]

## 2021-12-02 ENCOUNTER — Other Ambulatory Visit (HOSPITAL_COMMUNITY): Payer: Self-pay

## 2021-12-03 ENCOUNTER — Other Ambulatory Visit (HOSPITAL_COMMUNITY): Payer: Self-pay

## 2021-12-03 ENCOUNTER — Encounter (HOSPITAL_COMMUNITY): Payer: Self-pay | Admitting: Pharmacist

## 2021-12-03 ENCOUNTER — Other Ambulatory Visit: Payer: Self-pay

## 2021-12-04 ENCOUNTER — Other Ambulatory Visit: Payer: Self-pay

## 2021-12-05 DIAGNOSIS — R5383 Other fatigue: Secondary | ICD-10-CM | POA: Diagnosis not present

## 2021-12-05 DIAGNOSIS — R509 Fever, unspecified: Secondary | ICD-10-CM | POA: Diagnosis not present

## 2021-12-05 DIAGNOSIS — R5381 Other malaise: Secondary | ICD-10-CM | POA: Diagnosis not present

## 2021-12-05 DIAGNOSIS — Z6831 Body mass index (BMI) 31.0-31.9, adult: Secondary | ICD-10-CM | POA: Diagnosis not present

## 2021-12-05 DIAGNOSIS — J029 Acute pharyngitis, unspecified: Secondary | ICD-10-CM | POA: Diagnosis not present

## 2021-12-19 ENCOUNTER — Other Ambulatory Visit (HOSPITAL_COMMUNITY): Payer: Self-pay

## 2021-12-31 ENCOUNTER — Other Ambulatory Visit (HOSPITAL_COMMUNITY): Payer: Self-pay

## 2022-01-02 ENCOUNTER — Other Ambulatory Visit: Payer: Self-pay

## 2022-01-05 ENCOUNTER — Other Ambulatory Visit (HOSPITAL_COMMUNITY): Payer: Self-pay

## 2022-01-05 ENCOUNTER — Other Ambulatory Visit: Payer: Self-pay

## 2022-01-06 ENCOUNTER — Other Ambulatory Visit: Payer: Self-pay

## 2022-01-17 ENCOUNTER — Other Ambulatory Visit: Payer: Self-pay

## 2022-01-18 ENCOUNTER — Other Ambulatory Visit: Payer: Self-pay

## 2022-01-19 ENCOUNTER — Other Ambulatory Visit: Payer: Self-pay

## 2022-01-19 MED ORDER — WEGOVY 2.4 MG/0.75ML ~~LOC~~ SOAJ
SUBCUTANEOUS | 2 refills | Status: DC
  Filled 2022-01-19 – 2022-01-30 (×5): qty 3, 28d supply, fill #0
  Filled 2022-02-25: qty 3, 28d supply, fill #1
  Filled 2022-03-26: qty 3, 28d supply, fill #2

## 2022-01-20 ENCOUNTER — Other Ambulatory Visit: Payer: Self-pay

## 2022-01-20 ENCOUNTER — Other Ambulatory Visit (HOSPITAL_COMMUNITY): Payer: Self-pay

## 2022-01-27 ENCOUNTER — Other Ambulatory Visit: Payer: Self-pay

## 2022-01-28 ENCOUNTER — Other Ambulatory Visit: Payer: Self-pay

## 2022-01-30 ENCOUNTER — Other Ambulatory Visit: Payer: Self-pay

## 2022-02-11 ENCOUNTER — Other Ambulatory Visit: Payer: Self-pay

## 2022-02-26 ENCOUNTER — Other Ambulatory Visit: Payer: Self-pay

## 2022-03-19 ENCOUNTER — Encounter (HOSPITAL_COMMUNITY): Payer: Self-pay | Admitting: *Deleted

## 2022-03-26 ENCOUNTER — Other Ambulatory Visit: Payer: Self-pay

## 2022-04-08 ENCOUNTER — Other Ambulatory Visit: Payer: Self-pay

## 2022-04-08 MED ORDER — ESOMEPRAZOLE MAGNESIUM 40 MG PO CPDR
40.0000 mg | DELAYED_RELEASE_CAPSULE | Freq: Every morning | ORAL | 3 refills | Status: DC
  Filled 2022-04-08: qty 90, 90d supply, fill #0
  Filled 2022-07-08: qty 90, 90d supply, fill #1
  Filled 2022-10-09: qty 90, 90d supply, fill #2
  Filled 2023-01-05: qty 90, 90d supply, fill #3
  Filled ????-??-??: fill #2

## 2022-04-09 ENCOUNTER — Other Ambulatory Visit (HOSPITAL_COMMUNITY): Payer: Self-pay

## 2022-04-10 ENCOUNTER — Other Ambulatory Visit: Payer: Self-pay

## 2022-04-10 MED ORDER — SEMAGLUTIDE-WEIGHT MANAGEMENT 2.4 MG/0.75ML ~~LOC~~ SOAJ
2.4000 mg | SUBCUTANEOUS | 1 refills | Status: DC
  Filled 2022-04-10: qty 3, 28d supply, fill #0
  Filled 2022-05-12 – 2022-05-13 (×2): qty 3, 28d supply, fill #1

## 2022-04-17 ENCOUNTER — Other Ambulatory Visit: Payer: Self-pay

## 2022-04-20 ENCOUNTER — Other Ambulatory Visit: Payer: Self-pay

## 2022-05-01 ENCOUNTER — Other Ambulatory Visit: Payer: Self-pay

## 2022-05-01 DIAGNOSIS — Z6832 Body mass index (BMI) 32.0-32.9, adult: Secondary | ICD-10-CM | POA: Diagnosis not present

## 2022-05-01 DIAGNOSIS — Z0000 Encounter for general adult medical examination without abnormal findings: Secondary | ICD-10-CM | POA: Diagnosis not present

## 2022-05-01 MED ORDER — ESCITALOPRAM OXALATE 5 MG PO TABS
5.0000 mg | ORAL_TABLET | Freq: Every day | ORAL | 2 refills | Status: DC
  Filled 2022-05-01: qty 30, 30d supply, fill #0
  Filled 2022-05-30: qty 30, 30d supply, fill #1

## 2022-05-05 ENCOUNTER — Other Ambulatory Visit: Payer: Self-pay

## 2022-05-05 MED ORDER — PRAVASTATIN SODIUM 20 MG PO TABS
20.0000 mg | ORAL_TABLET | Freq: Every evening | ORAL | 2 refills | Status: DC
  Filled 2022-05-05: qty 30, 30d supply, fill #0
  Filled 2022-05-30: qty 30, 30d supply, fill #1
  Filled 2022-07-07: qty 30, 30d supply, fill #2

## 2022-05-12 ENCOUNTER — Other Ambulatory Visit: Payer: Self-pay

## 2022-05-13 ENCOUNTER — Other Ambulatory Visit: Payer: Self-pay

## 2022-05-19 ENCOUNTER — Other Ambulatory Visit: Payer: Self-pay

## 2022-05-29 ENCOUNTER — Other Ambulatory Visit: Payer: Self-pay

## 2022-05-31 ENCOUNTER — Other Ambulatory Visit: Payer: Self-pay

## 2022-06-08 ENCOUNTER — Other Ambulatory Visit (HOSPITAL_COMMUNITY): Payer: Self-pay

## 2022-06-15 ENCOUNTER — Other Ambulatory Visit: Payer: Self-pay

## 2022-06-15 MED ORDER — ESCITALOPRAM OXALATE 10 MG PO TABS
10.0000 mg | ORAL_TABLET | Freq: Every day | ORAL | 2 refills | Status: DC
  Filled 2022-06-15: qty 30, 30d supply, fill #0
  Filled 2022-07-07: qty 30, 30d supply, fill #1
  Filled 2022-08-18: qty 30, 30d supply, fill #2

## 2022-07-07 ENCOUNTER — Other Ambulatory Visit: Payer: Self-pay

## 2022-07-09 ENCOUNTER — Other Ambulatory Visit: Payer: Self-pay

## 2022-08-11 ENCOUNTER — Other Ambulatory Visit: Payer: Self-pay

## 2022-08-11 MED ORDER — PRAVASTATIN SODIUM 20 MG PO TABS
20.0000 mg | ORAL_TABLET | Freq: Every evening | ORAL | 2 refills | Status: DC
  Filled 2022-08-11: qty 30, 30d supply, fill #0
  Filled 2022-09-13: qty 60, 60d supply, fill #1

## 2022-08-18 ENCOUNTER — Other Ambulatory Visit: Payer: Self-pay

## 2022-09-13 ENCOUNTER — Other Ambulatory Visit: Payer: Self-pay

## 2022-09-14 ENCOUNTER — Other Ambulatory Visit: Payer: Self-pay

## 2022-09-22 ENCOUNTER — Other Ambulatory Visit: Payer: Self-pay

## 2022-09-30 ENCOUNTER — Ambulatory Visit: Payer: 59 | Admitting: Dermatology

## 2022-09-30 ENCOUNTER — Encounter: Payer: Self-pay | Admitting: Dermatology

## 2022-09-30 VITALS — BP 173/113 | HR 67

## 2022-09-30 DIAGNOSIS — W908XXA Exposure to other nonionizing radiation, initial encounter: Secondary | ICD-10-CM | POA: Diagnosis not present

## 2022-09-30 DIAGNOSIS — L814 Other melanin hyperpigmentation: Secondary | ICD-10-CM | POA: Diagnosis not present

## 2022-09-30 DIAGNOSIS — D1801 Hemangioma of skin and subcutaneous tissue: Secondary | ICD-10-CM | POA: Diagnosis not present

## 2022-09-30 DIAGNOSIS — L821 Other seborrheic keratosis: Secondary | ICD-10-CM

## 2022-09-30 DIAGNOSIS — L578 Other skin changes due to chronic exposure to nonionizing radiation: Secondary | ICD-10-CM | POA: Diagnosis not present

## 2022-09-30 DIAGNOSIS — Z1283 Encounter for screening for malignant neoplasm of skin: Secondary | ICD-10-CM | POA: Diagnosis not present

## 2022-09-30 DIAGNOSIS — D229 Melanocytic nevi, unspecified: Secondary | ICD-10-CM

## 2022-09-30 DIAGNOSIS — Z808 Family history of malignant neoplasm of other organs or systems: Secondary | ICD-10-CM

## 2022-09-30 NOTE — Patient Instructions (Addendum)
Hello Alexander James,  Thank you for visiting [place] today at [facility]. Your proactive approach to monitoring changes in your skin due to sun exposure is commendable. Here's a summary of the essential advice from your consultation with Dr. Langston Reusing:  Wynelle Link Protection: It's crucial to continue using sunscreen and wearing hats, especially during outdoor activities like beach visits or tubing. Ensure you apply sunscreen to often-missed areas, including your ears and the back of your neck.  - Daily Skin Care: Add a moisturizer with sunscreen to your daily regimen to guard against the cumulative effects of sun damage, such as those encountered during everyday activities like driving.  - Observation of Skin Changes: Vigilantly monitor for new or changing moles, especially those that are black or exhibit growth. Also, be alert to non-healing pimples, as these may signal basal cell carcinomas.  - Annual Skin Examinations: Your history and family background of skin cancer necessitate regular full-body skin examinations. These are vital for the early detection and treatment of any skin concerns.   We eagerly anticipate your next visit, whether for your scheduled examination or sooner should you observe any worrying changes. Do not hesitate to contact us with any questions or for additional support.  Wishing you the best of health,  Dr. [facility] Reusing [facility]   Important Information  Due to recent changes in healthcare laws, you may see results of your pathology and/or laboratory studies on MyChart before the doctors have had a chance to review them. We understand that in some cases there may be results that are confusing or concerning to you. Please understand that not all results are received at the same time and often the doctors may need to interpret multiple results in order to provide you with the best plan of care or course of treatment. Therefore, we ask that you please give [place] 2  business days to thoroughly review all your results before contacting the office for clarification. Should we see a critical lab result, you will be contacted sooner.   If You Need Anything After Your Visit  If you have any questions or concerns for your doctor, please call our main line at [phone] If no one answers, please leave a voicemail as directed and we will return your call as soon as possible. Messages left after 4 pm will be answered the following business day.   You may also send [place] a message via MyChart. We typically respond to MyChart messages within 1-2 business days.  For prescription refills, please ask your pharmacy to contact our office. Our fax number is [phone].  If you have an urgent issue when the clinic is closed that cannot wait until the next business day, you can page your doctor at the number below.    Please note that while we do our best to be available for urgent issues outside of office hours, we are not available 24/7.   If you have an urgent issue and are unable to reach [place], you may choose to seek medical care at your doctor's office, retail clinic, urgent care center, or emergency room.  If you have a medical emergency, please immediately call 911 or go to the emergency department. In the event of inclement weather, please call our main line at [phone] for an update on the status of any delays or closures.  Dermatology Medication Tips: Please keep the boxes that topical medications come in in order to help keep track of the instructions about where and how to use  these. Pharmacies typically print the medication instructions only on the boxes and not directly on the medication tubes.   If your medication is too expensive, please contact our office at [phone] or send [place] a message through MyChart.   We are unable to tell what your co-pay for medications will be in advance as this is different depending on your insurance coverage. However, we  may be able to find a substitute medication at lower cost or fill out paperwork to get insurance to cover a needed medication.   If a prior authorization is required to get your medication covered by your insurance company, please allow [place] 1-2 business days to complete this process.  Drug prices often vary depending on where the prescription is filled and some pharmacies may offer cheaper prices.  The website www.goodrx.com contains coupons for medications through different pharmacies. The prices here do not account for what the cost may be with help from insurance (it may be cheaper with your insurance), but the website can give you the price if you did not use any insurance.  - You can print the associated coupon and take it with your prescription to the pharmacy.  - You may also stop by our office during regular business hours and pick up a GoodRx coupon card.  - If you need your prescription sent electronically to a different pharmacy, notify our office through [facility] or by phone at [phone]

## 2022-09-30 NOTE — Progress Notes (Signed)
   New Patient Visit   Subjective  Alexander James is a 42 y.o. male who presents for the following: TBSE  Patient present today for new patient for TBSE. Patient reports he was previously a patient at [facility]. His last TBSE was last August. Patient reports throughout his lifetime has had  severe  sun exposure. Currently, patient reports if she has excessive sun exposure, he does apply sunscreen and/or wears protective coverings when at the beach but not daily. Patient denies personal hx of bx. Patient reports he has family hx of skin cancers (Mom and Grand mom).  The patient has a spot located on the back of the left calf to be evaluated, spot may be new or changing and the patient may have concern these could be cancer.  The following portions of the chart were reviewed this encounter and updated as appropriate: medications, allergies, medical history  Review of Systems:  No other skin or systemic complaints except as noted in HPI or Assessment and Plan.  Objective  Well appearing patient in no apparent distress; mood and affect are within normal limits.  A full examination was performed including scalp, head, eyes, ears, nose, lips, neck, chest, axillae, abdomen, back, buttocks, bilateral upper extremities, bilateral lower extremities, hands, feet, fingers, toes, fingernails, and toenails. All findings within normal limits unless otherwise noted below.   Relevant exam findings are noted in the Assessment and Plan.    Assessment & Plan   LENTIGINES, SEBORRHEIC KERATOSES, Cherry ANGIOMAS  - Benign normal skin lesions - Benign-appearing - Call for any changes  MELANOCYTIC NEVI - Tan-brown and/or pink-flesh-colored symmetric macules and papules - Benign appearing on exam today - Observation - Call clinic for new or changing moles - Recommend daily use of broad spectrum spf 30+ sunscreen to sun-exposed areas.   Mild ACTINIC DAMAGE - Chronic condition, secondary to  cumulative UV/sun exposure - diffuse scaly erythematous macules with underlying dyspigmentation - Recommend daily broad spectrum sunscreen SPF 30+ to sun-exposed areas, reapply every 2 hours as needed.  - Staying in the shade or wearing long sleeves, sun glasses (UVA+UVB protection) and wide brim hats (4-inch brim around the entire circumference of the hat) are also recommended for sun protection.  - Call for new or changing lesions.  SKIN CANCER SCREENING PERFORMED TODAY     No follow-ups on file.  Documentation: I have reviewed the above documentation for accuracy and completeness, and I agree with the above.  Stasia Cavalier, am acting as scribe for Langston Reusing, DO.  Langston Reusing, DO

## 2022-10-01 ENCOUNTER — Ambulatory Visit: Payer: Commercial Managed Care - PPO | Admitting: Dermatology

## 2022-10-09 ENCOUNTER — Other Ambulatory Visit: Payer: Self-pay

## 2022-11-04 ENCOUNTER — Other Ambulatory Visit: Payer: Self-pay

## 2022-11-04 DIAGNOSIS — Z23 Encounter for immunization: Secondary | ICD-10-CM | POA: Diagnosis not present

## 2022-11-04 DIAGNOSIS — F411 Generalized anxiety disorder: Secondary | ICD-10-CM | POA: Diagnosis not present

## 2022-11-04 DIAGNOSIS — Z1331 Encounter for screening for depression: Secondary | ICD-10-CM | POA: Diagnosis not present

## 2022-11-04 MED ORDER — ESCITALOPRAM OXALATE 10 MG PO TABS
10.0000 mg | ORAL_TABLET | Freq: Every day | ORAL | 2 refills | Status: DC
  Filled 2022-11-04: qty 90, 90d supply, fill #0
  Filled 2023-01-31: qty 90, 90d supply, fill #1
  Filled 2023-04-22: qty 90, 90d supply, fill #2

## 2022-11-20 ENCOUNTER — Ambulatory Visit (HOSPITAL_COMMUNITY)
Admission: RE | Admit: 2022-11-20 | Discharge: 2022-11-20 | Disposition: A | Discharge status: Home / Self Care | Payer: 59 | Source: Ambulatory Visit | Attending: Physician Assistant | Admitting: Physician Assistant

## 2022-11-20 ENCOUNTER — Other Ambulatory Visit: Payer: Self-pay

## 2022-11-20 VITALS — BP 164/100 | HR 70 | Ht 73.0 in | Wt 268.6 lb

## 2022-11-20 DIAGNOSIS — I480 Paroxysmal atrial fibrillation: Secondary | ICD-10-CM | POA: Diagnosis not present

## 2022-11-20 DIAGNOSIS — Z79899 Other long term (current) drug therapy: Secondary | ICD-10-CM | POA: Insufficient documentation

## 2022-11-20 DIAGNOSIS — I10 Essential (primary) hypertension: Secondary | ICD-10-CM | POA: Diagnosis not present

## 2022-11-20 MED ORDER — DILTIAZEM HCL ER COATED BEADS 120 MG PO CP24
120.0000 mg | ORAL_CAPSULE | Freq: Every day | ORAL | 3 refills | Status: DC
  Filled 2022-11-20: qty 90, 90d supply, fill #0
  Filled 2023-01-31 – 2023-02-04 (×3): qty 90, 90d supply, fill #1
  Filled 2023-04-22: qty 90, 90d supply, fill #2
  Filled 2023-07-29: qty 90, 90d supply, fill #3

## 2022-11-20 NOTE — Progress Notes (Signed)
Primary Care Physician: Noni Saupe, MD Referring Physician: Dr. Suezanne Cheshire is a 42 y.o. male with a h/o HTN, paroxysmal atrial fibrillation who presents for follow up in the [facility]. Patient has a CHA2DS2VASc score is 1. His is s/p afib ablation 11/09/19. He maintained SR and his flecainide was discontinued.   On follow up today, patient reports that he has done well since his last visit. He did have an episode of tachypalpitations which last < 5 seconds. He had a visit with his PCP last week and his BP was within normal range.   Today, he denies symptoms of palpitations, chest pain, shortness of breath, orthopnea, PND, lower extremity edema, dizziness, presyncope, syncope, or neurologic sequela. The patient is tolerating medications without difficulties and is otherwise without complaint today.   Past Medical History:  Diagnosis Date   Essential hypertension 06/09/2019   Family history of malignant neoplasm of gastrointestinal tract 06/06/2013   Father with colon cancer age 50    Family hx of colon cancer    father   GERD (gastroesophageal reflux disease)    Overweight    Paroxysmal A-fib (HCC) 06/09/2019    Current Outpatient Medications  Medication Sig Dispense Refill   escitalopram (LEXAPRO) 10 MG tablet Take 1 tablet (10 mg total) by mouth daily. 90 tablet 2   esomeprazole (NEXIUM) 40 MG capsule Take 1 capsule (40 mg total) by mouth every morning. 90 capsule 3   Multiple Vitamins-Minerals (MULTIVITAMIN GUMMIES ADULT PO) Take 2 tablets by mouth daily.     pravastatin (PRAVACHOL) 20 MG tablet Take 1 tablet (20 mg total) by mouth at bedtime. 30 tablet 2   diltiazem (CARTIA XT) 120 MG 24 hr capsule Take 1 capsule (120 mg total) by mouth daily. 90 capsule 3   No current facility-administered medications for this encounter.    ROS- All systems are reviewed and negative except as per the HPI above  Physical Exam: Vitals:    11/20/22 0833  BP: (!) 164/100  Pulse: 70  Weight: 121.8 kg  Height: 6\' 1"  (1.854 m)   Wt Readings from Last 3 Encounters:  11/20/22 121.8 kg  11/14/21 115 kg  05/23/21 118.1 kg    GEN: Well nourished, well developed in no acute distress NECK: No JVD; No carotid bruits CARDIAC: Regular rate and rhythm, no murmurs, rubs, gallops RESPIRATORY:  Clear to auscultation without rales, wheezing or rhonchi  ABDOMEN: Soft, non-tender, non-distended EXTREMITIES:  No edema; No deformity     EKG today demonstrates SR Vent. rate 70 BPM PR interval 176 ms QRS duration 96 ms QT/QTcB 392/423 ms   CHA2DS2-VASc Score = 1  The patient's score is based upon: CHF History: 0 HTN History: 1 Diabetes History: 0 Stroke History: 0 Vascular Disease History: 0 Age Score: 0 Gender Score: 0       ASSESSMENT AND PLAN: Paroxysmal Atrial Fibrillation (ICD10:  I48.0) The patient's CHA2DS2-VASc score is 1, indicating a 0.6% annual risk of stroke.   S/p afib ablation 2021 Patient appears to be maintaining SR. Not currently on anticoagulation with a low CV score of 1.  Continue diltiazem 120 mg daily  HTN Elevated today but within normal range at PCP office. Patient to keep home BP log and reach out if readings are persistently elevated.    Follow up with EP in one year to establish care. He lives in [place].    Jorja Loa PA-C [facility] [place]  [facility] [address] [place], [place] [address] [phone]

## 2022-11-25 ENCOUNTER — Other Ambulatory Visit: Payer: Self-pay

## 2022-11-25 MED ORDER — PRAVASTATIN SODIUM 20 MG PO TABS
20.0000 mg | ORAL_TABLET | Freq: Every evening | ORAL | 2 refills | Status: DC
  Filled 2022-11-25 – 2022-12-11 (×2): qty 30, 30d supply, fill #0
  Filled 2023-01-19: qty 30, 30d supply, fill #1
  Filled 2023-02-27: qty 30, 30d supply, fill #2

## 2022-12-07 ENCOUNTER — Other Ambulatory Visit: Payer: Self-pay

## 2022-12-11 ENCOUNTER — Other Ambulatory Visit: Payer: Self-pay

## 2022-12-30 ENCOUNTER — Ambulatory Visit: Payer: 59 | Admitting: Podiatry

## 2022-12-30 ENCOUNTER — Ambulatory Visit: Payer: 59

## 2022-12-30 ENCOUNTER — Other Ambulatory Visit: Payer: Self-pay

## 2022-12-30 ENCOUNTER — Encounter: Payer: Self-pay | Admitting: Podiatry

## 2022-12-30 DIAGNOSIS — M779 Enthesopathy, unspecified: Secondary | ICD-10-CM

## 2022-12-30 DIAGNOSIS — M19071 Primary osteoarthritis, right ankle and foot: Secondary | ICD-10-CM | POA: Diagnosis not present

## 2022-12-30 MED ORDER — DICLOFENAC SODIUM 75 MG PO TBEC
75.0000 mg | DELAYED_RELEASE_TABLET | Freq: Two times a day (BID) | ORAL | 0 refills | Status: DC
  Filled 2022-12-30: qty 60, 30d supply, fill #0

## 2023-01-03 NOTE — Progress Notes (Signed)
  Subjective:  Patient ID: Alexander James, male    DOB: [birth year],  MRN: [id number]  Chief Complaint  Patient presents with   Foot Pain    "My right ankle hurts."    Discussed the use of AI scribe software for clinical note transcription with the patient, who gave verbal consent to proceed.  History of Present Illness   The patient, with a history of multiple ankle sprains from high school and college football, presents with two weeks of severe right ankle pain. The pain was localized to the lateral aspect of the ankle and was worse in the mornings. The pain was different from the usual discomfort he experiences in both ankles. There was no associated redness, swelling, or heat. The pain has since improved and is now mild. The patient denies any family history of autoimmune diseases such as lupus or rheumatoid arthritis. He also reports that standing on concrete all day exacerbates the pain in both ankles.          Objective:    Physical Exam   MUSCULOSKELETAL: Mild pain with subtalar range of motion. Good range of motion in ankle and subtalar joint. Mild pain in the sinus tarsus. No pain on anterior ankle joint, medial deltoid, peroneal, or lateral ankle ligaments.       No images are attached to the encounter.    Results   RADIOLOGY Right ankle radiographs: Well-maintained and aligned ankle mortise. No acute fracture or stress fracture. Sclerosis of the subtalar joint with arthritic changes.      Assessment:   1. Arthritis of right subtalar joint      Plan:  Patient was evaluated and treated and all questions answered.  Assessment and Plan    Subtalar Arthritis   Recent flare of subtalar joint pain in this individual is likely due to osteoarthritis, with no evidence of acute inflammation or infection noted. His pain has improved at the time of the visit. He has a history of recurrent ankle sprains, and an X-ray has revealed sclerosis and arthritic changes in the  subtalar joint. We will prescribe Diclofenac for pain as needed. Should the pain become consistent or more intense, a cortisone injection will be considered. Long-term management may include fusion surgery, but this will only be considered when absolutely necessary due to the significant recovery time and its impact on his work.          Return if symptoms worsen or fail to improve.

## 2023-01-06 ENCOUNTER — Other Ambulatory Visit: Payer: Self-pay

## 2023-01-19 ENCOUNTER — Other Ambulatory Visit: Payer: Self-pay

## 2023-01-31 ENCOUNTER — Other Ambulatory Visit: Payer: Self-pay

## 2023-02-01 ENCOUNTER — Other Ambulatory Visit: Payer: Self-pay

## 2023-02-25 DIAGNOSIS — Z6837 Body mass index (BMI) 37.0-37.9, adult: Secondary | ICD-10-CM | POA: Diagnosis not present

## 2023-02-25 DIAGNOSIS — J019 Acute sinusitis, unspecified: Secondary | ICD-10-CM | POA: Diagnosis not present

## 2023-02-28 ENCOUNTER — Other Ambulatory Visit: Payer: Self-pay

## 2023-03-02 DIAGNOSIS — R059 Cough, unspecified: Secondary | ICD-10-CM | POA: Diagnosis not present

## 2023-03-02 DIAGNOSIS — J019 Acute sinusitis, unspecified: Secondary | ICD-10-CM | POA: Diagnosis not present

## 2023-03-09 ENCOUNTER — Other Ambulatory Visit: Payer: Self-pay

## 2023-03-09 MED ORDER — AMOXICILLIN-POT CLAVULANATE 875-125 MG PO TABS
1.0000 | ORAL_TABLET | Freq: Two times a day (BID) | ORAL | 0 refills | Status: DC
  Filled 2023-03-09: qty 14, 7d supply, fill #0

## 2023-03-29 ENCOUNTER — Other Ambulatory Visit: Payer: Self-pay

## 2023-03-29 MED ORDER — ESOMEPRAZOLE MAGNESIUM 40 MG PO CPDR
40.0000 mg | DELAYED_RELEASE_CAPSULE | Freq: Every morning | ORAL | 3 refills | Status: DC
  Filled 2023-03-29: qty 90, 90d supply, fill #0
  Filled 2023-06-28: qty 90, 90d supply, fill #1
  Filled 2023-09-26: qty 90, 90d supply, fill #2
  Filled 2023-12-22: qty 90, 90d supply, fill #3

## 2023-03-29 MED ORDER — PRAVASTATIN SODIUM 20 MG PO TABS
20.0000 mg | ORAL_TABLET | Freq: Every evening | ORAL | 2 refills | Status: DC
  Filled 2023-03-29: qty 30, 30d supply, fill #0
  Filled 2023-04-22: qty 30, 30d supply, fill #1
  Filled 2023-06-01: qty 30, 30d supply, fill #2

## 2023-04-23 ENCOUNTER — Other Ambulatory Visit: Payer: Self-pay

## 2023-04-26 ENCOUNTER — Other Ambulatory Visit: Payer: Self-pay

## 2023-04-29 ENCOUNTER — Other Ambulatory Visit: Payer: Self-pay

## 2023-05-07 ENCOUNTER — Encounter (HOSPITAL_BASED_OUTPATIENT_CLINIC_OR_DEPARTMENT_OTHER): Payer: Self-pay | Admitting: Student

## 2023-05-07 ENCOUNTER — Ambulatory Visit (HOSPITAL_BASED_OUTPATIENT_CLINIC_OR_DEPARTMENT_OTHER): Payer: Commercial Managed Care - PPO | Admitting: Student

## 2023-05-07 VITALS — BP 157/116 | HR 85 | Temp 97.9°F | Resp 16 | Ht 72.64 in | Wt 274.9 lb

## 2023-05-07 DIAGNOSIS — Z6836 Body mass index (BMI) 36.0-36.9, adult: Secondary | ICD-10-CM | POA: Diagnosis not present

## 2023-05-07 DIAGNOSIS — I10 Essential (primary) hypertension: Secondary | ICD-10-CM

## 2023-05-07 DIAGNOSIS — I480 Paroxysmal atrial fibrillation: Secondary | ICD-10-CM | POA: Diagnosis not present

## 2023-05-07 DIAGNOSIS — E785 Hyperlipidemia, unspecified: Secondary | ICD-10-CM | POA: Diagnosis not present

## 2023-05-07 DIAGNOSIS — Z7689 Persons encountering health services in other specified circumstances: Secondary | ICD-10-CM

## 2023-05-07 DIAGNOSIS — K219 Gastro-esophageal reflux disease without esophagitis: Secondary | ICD-10-CM

## 2023-05-07 DIAGNOSIS — Z0000 Encounter for general adult medical examination without abnormal findings: Secondary | ICD-10-CM

## 2023-05-07 DIAGNOSIS — Z789 Other specified health status: Secondary | ICD-10-CM | POA: Insufficient documentation

## 2023-05-07 DIAGNOSIS — Z13220 Encounter for screening for lipoid disorders: Secondary | ICD-10-CM | POA: Diagnosis not present

## 2023-05-07 DIAGNOSIS — Z131 Encounter for screening for diabetes mellitus: Secondary | ICD-10-CM

## 2023-05-07 DIAGNOSIS — Z136 Encounter for screening for cardiovascular disorders: Secondary | ICD-10-CM

## 2023-05-07 NOTE — Assessment & Plan Note (Signed)
 Discussed dietary habits, exercise, and social interactions. He has a physical job and plans to increase physical activity with his daughter. Sleep is adequate at 6-7 hours per night. Social interactions maintained through rodeo events and outings with friends. Emphasized importance of maintaining social interactions and adequate sleep to reduce long-term dementia risk. - Encourage dietary modifications, including reducing intake of high-calorie foods and increasing protein intake. - Recommend monitoring step count to achieve 10,000 steps per day. - Encourage maintaining social interactions outside of work. - Advise aiming for 7-10 hours of sleep per night.

## 2023-05-07 NOTE — Assessment & Plan Note (Signed)
 Hypertension with previous readings in the 160s. He has not been monitoring blood pressure at home. Stress and alcohol consumption may contribute. Family history of hypertension. Discussed potential need for medication if home readings remain elevated. Losartan considered for blood pressure management and stroke risk reduction, given family history. - Recommend obtaining a home blood pressure cuff for monitoring. - Follow up in two weeks with blood pressure readings. - Consider starting losartan if home readings remain elevated.

## 2023-05-07 NOTE — Progress Notes (Addendum)
 New Patient Office Visit  Subjective    Patient ID: Alexander James, male    DOB: 12/10/1980  Age: 43 y.o. MRN: [id number]  CC:  Chief Complaint  Patient presents with   Establish Care    Here to establish care.   Annual Exam    Would like physical. Fasted.    Discussed the use of AI scribe software for clinical note transcription with the patient, who gave verbal consent to proceed.  History of Present Illness   Alexander James is a 43 year old male who presents to establish care and for an annual physical exam. He previously received primary care at [facility] but switched recently- was seeing Dr. Jeanie Sewer at [facility].  He has a history of atrial fibrillation and has undergone an ablation in the past. He experiences occasional episodes that resolve within ten seconds. He visits the AFib clinic once or twice a year. He has not had an episode in three to four months. Sp ablation on 11/09/19. Maintained SR and flecainide was discontinued. Currently on diltiazem 120mg  for rate control. No chest pain, shortness of breath, dizziness, or leg swelling.  He has high cholesterol and is currently on pravastatin. He has not been on any other medications for cholesterol. No history of diabetes or gout.  He reports a significant amount of stress related to his work in the [employer], which he attributes to increased alcohol consumption and weight gain of about twenty pounds since November. He describes his diet as poor, often eating a biscuit for breakfast and relying on fruits for lunch when his wife is available to prepare them. He admits to eating unhealthy meals when his wife is out of town. He has gained weight and acknowledges the need to improve his diet. He reports good sleep quality, averaging six to seven hours per night. He engages in physical activity through his work, which is physically demanding, and plans to start walking with his daughter.  He has a family history of high blood  pressure. He does not currently monitor his blood pressure at home but plans to start doing so.   Long Term history of GERD, managed on nexium. Alcohol intake is likely a contributing factor.     Screenings:  Colon Cancer: UP TO DATE- has 2 more years until renewal. Lung Cancer: Does not smoke. Diabetes: indicated HLD: high in the past-  stable on pravastatin. Will reassess today. Vaccinations: UTD The 10-year ASCVD risk score (Arnett DK, et al., 2019) is: 3.3%  Outpatient Encounter Medications as of 05/07/2023  Medication Sig   diclofenac (VOLTAREN) 75 MG EC tablet Take 1 tablet (75 mg total) by mouth 2 (two) times daily.   diltiazem (CARTIA XT) 120 MG 24 hr capsule Take 1 capsule (120 mg total) by mouth daily.   escitalopram (LEXAPRO) 10 MG tablet Take 1 tablet (10 mg total) by mouth daily.   esomeprazole (NEXIUM) 40 MG capsule Take 1 capsule (40 mg total) by mouth every morning.   Multiple Vitamins-Minerals (MULTIVITAMIN GUMMIES ADULT PO) Take 2 tablets by mouth daily.   pravastatin (PRAVACHOL) 20 MG tablet Take 1 tablet (20 mg total) by mouth at bedtime.   [DISCONTINUED] amoxicillin-clavulanate (AUGMENTIN) 875-125 MG tablet Take 1 tablet by mouth 2 (two) times daily for 7 days for sinusitis (Patient not taking: Reported on 05/07/2023)   No facility-administered encounter medications on file as of 05/07/2023.    Past Medical History:  Diagnosis Date   Essential hypertension 06/09/2019  Family history of malignant neoplasm of gastrointestinal tract 06/06/2013   Father with colon cancer age 66    Family hx of colon cancer    father   GERD (gastroesophageal reflux disease)    Overweight    Paroxysmal A-fib (HCC) 06/09/2019    Past Surgical History:  Procedure Laterality Date   ATRIAL FIBRILLATION ABLATION N/A 11/09/2019   Procedure: ATRIAL FIBRILLATION ABLATION;  Surgeon: Hillis Range, MD;  Location: [facility];  Service: Cardiovascular;  Laterality: N/A;   COLONOSCOPY  N/A 05/23/2021   Procedure: COLONOSCOPY;  Surgeon: Midge Minium, MD;  Location: [facility];  Service: Endoscopy;  Laterality: N/A;    Family History  Problem Relation Age of Onset   Hypertension Mother    Colon cancer Father 69    Social History   Socioeconomic History   Marital status: Married    Spouse name: Not on file   Number of children: 2   Years of education: Not on file   Highest education level: Not on file  Occupational History   Occupation: Art gallery manager: [employer]  Tobacco Use   Smoking status: Never    Passive exposure: Never   Smokeless tobacco: Never  Vaping Use   Vaping status: Never Used  Substance and Sexual Activity   Alcohol use: Not Currently    Comment: usually drinks 3-4 beers a day   Drug use: No   Sexual activity: Not on file  Other Topics Concern   Not on file  Social History Narrative   Lives in [place] with spouse and 2 daughters   Owns a hauling and grading business   Social Drivers of Health   Financial Resource Strain: Not on file  Food Insecurity: No Food Insecurity (05/07/2023)   Hunger Vital Sign    Worried About Running Out of Food in the Last Year: Never true    Ran Out of Food in the Last Year: Never true  Transportation Needs: No Transportation Needs (05/07/2023)   PRAPARE - Administrator, Civil Service (Medical): No    Lack of Transportation (Non-Medical): No  Physical Activity: Not on file  Stress: Not on file  Social Connections: Not on file  Intimate Partner Violence: Not At Risk (05/07/2023)   Humiliation, Afraid, Rape, and Kick questionnaire    Fear of Current or Ex-Partner: No    Emotionally Abused: No    Physically Abused: No    Sexually Abused: No    ROS  Per HPI      Objective    BP (!) 157/116   Pulse 85   Temp 97.9 F (36.6 C) (Oral)   Resp 16   Ht 6' 0.64" (1.845 m)   Wt 274 lb 14.4 oz (124.7 kg)   SpO2 96%   BMI 36.63 kg/m   Physical  Exam Constitutional:      General: He is not in acute distress.    Appearance: Normal appearance. He is not ill-appearing or diaphoretic.  HENT:     Head: Normocephalic and atraumatic.     Right Ear: External ear normal.     Left Ear: External ear normal.     Nose: Nose normal.     Mouth/Throat:     Mouth: Mucous membranes are moist.     Pharynx: Oropharynx is clear.  Eyes:     General: No scleral icterus.       Right eye: No discharge.  Left eye: No discharge.     Extraocular Movements: Extraocular movements intact.     Conjunctiva/sclera: Conjunctivae normal.     Pupils: Pupils are equal, round, and reactive to light.  Neck:     Thyroid: No thyroid mass, thyromegaly or thyroid tenderness.     Vascular: No carotid bruit.  Cardiovascular:     Rate and Rhythm: Normal rate and regular rhythm.     Pulses: Normal pulses.     Heart sounds: Normal heart sounds. No murmur heard.    No friction rub. No gallop.  Pulmonary:     Effort: Pulmonary effort is normal.     Breath sounds: Normal breath sounds. No wheezing, rhonchi or rales.  Chest:     Chest wall: No tenderness.  Musculoskeletal:        General: No swelling, deformity or signs of injury.     Right lower leg: No edema.     Left lower leg: No edema.  Lymphadenopathy:     Cervical:     Right cervical: No superficial or posterior cervical adenopathy.    Left cervical: No superficial cervical adenopathy.  Skin:    Coloration: Skin is not jaundiced.     Findings: No rash.  Neurological:     General: No focal deficit present.     Mental Status: He is alert and oriented to person, place, and time.     Deep Tendon Reflexes: Reflexes normal.  Psychiatric:        Behavior: Behavior normal.         Assessment & Plan:   Routine general medical examination at a health care facility -     CBC with Differential/Platelet -     Comprehensive metabolic panel with GFR -     Hemoglobin A1c -     Lipid panel  Encounter  to establish care  Essential hypertension Assessment & Plan: Hypertension with previous readings in the 160s. He has not been monitoring blood pressure at home. Stress and alcohol consumption may contribute. Family history of hypertension. Discussed potential need for medication if home readings remain elevated. Losartan considered for blood pressure management and stroke risk reduction, given family history. - Recommend obtaining a home blood pressure cuff for monitoring. - Follow up in two weeks with blood pressure readings. - Consider starting losartan if home readings remain elevated.  Orders: -     CBC with Differential/Platelet -     Comprehensive metabolic panel with GFR  Paroxysmal atrial fibrillation (HCC) Assessment & Plan: Stable Atrial fibrillation with previous ablation. Occasional episodes resolve within ten seconds- not sure that these 'episodes' are true afib due to previous holter monitor results. No recent episodes in the last three to four months. Discussed that Alcohol may exacerbate symptoms ('holiday heart'). - Continue annual or biannual follow-ups at the AFib clinic. - Monitor for increased frequency of atrial fibrillation episodes. - Continue diltiazem 120mg  daily. - Decrease alcohol intake. - Continue to follow annually with afib clinic.  Orders: -     CBC with Differential/Platelet -     Comprehensive metabolic panel with GFR  Gastroesophageal reflux disease without esophagitis Assessment & Plan: - Managed with Nexium- continue. - Consider endoscopy with next colonoscopy to rule out Barrett's Esophagus. - Decrease alcohol intake.  Orders: -     Comprehensive metabolic panel with GFR  Alcohol use Assessment & Plan: Increased alcohol consumption potentially contributing to hypertension and concerning in history of atrial fibrillation. He acknowledges the need to reduce intake but  has not significantly attempted to cut down. Stress from work is a  contributing factor. - Encourage reduction in alcohol consumption to help manage blood pressure and overall risk reduction.   BMI 36.0-36.9,adult Assessment & Plan: Discussed dietary habits, exercise, and social interactions. He has a physical job and plans to increase physical activity with his daughter. Sleep is adequate at 6-7 hours per night. Social interactions maintained through rodeo events and outings with friends. Emphasized importance of maintaining social interactions and adequate sleep to reduce long-term dementia risk. - Encourage dietary modifications, including reducing intake of high-calorie foods and increasing protein intake. - Recommend monitoring step count to achieve 10,000 steps per day. - Encourage maintaining social interactions outside of work. - Advise aiming for 7-10 hours of sleep per night.    Hyperlipidemia, unspecified hyperlipidemia type Assessment & Plan: Hyperlipidemia stable and managed with pravastatin. Order lipid panel to reassess.   Encounter for lipid screening for cardiovascular disease -     Lipid panel  Screening for diabetes mellitus -     Hemoglobin A1c   25 modifier added for annual physical on top of the new patient visit.  Return in about 6 months (around 11/07/2023) for Chronic Followup.   Teryl Lucy Mosiah Bastin, PA-C

## 2023-05-07 NOTE — Assessment & Plan Note (Addendum)
 Increased alcohol consumption potentially contributing to hypertension and concerning in history of atrial fibrillation. He acknowledges the need to reduce intake but has not significantly attempted to cut down. Stress from work is a contributing factor. - Encourage reduction in alcohol consumption to help manage blood pressure and overall risk reduction.

## 2023-05-07 NOTE — Assessment & Plan Note (Signed)
 Hyperlipidemia stable and managed with pravastatin. Order lipid panel to reassess.

## 2023-05-07 NOTE — Patient Instructions (Addendum)
 It was nice to see you today!  As we discussed in clinic please get an upper arm cuff and check your BP at home for the next two weeks and send me a message with the results. I would encourage you to decrease the amount of salt that you are taking in for the sake of your blood pressure as well as your alcohol intake.  For your reflux- you may try the following. Anti-reflux measures such as raising the head of the bed, avoiding tight clothing or belts, avoiding eating late at night and not lying down shortly after mealtime and achieving weight loss. Avoid Aspirin, NSAID's such as ibuprofen and aleve), caffeine, peppermints, alcohol, and tobacco.   If you have any problems before your next visit feel free to message me via MyChart (minor issues or questions) or call the office, otherwise you may reach out to schedule an office visit.  Thank you! Gerilyn Pilgrim Kandon Hosking, PA-C  Health Maintenance, Male Adopting a healthy lifestyle and getting preventive care are important in promoting health and wellness. Ask your health care provider about: The right schedule for you to have regular tests and exams. Things you can do on your own to prevent diseases and keep yourself healthy. What should I know about diet, weight, and exercise? Eat a healthy diet  Eat a diet that includes plenty of vegetables, fruits, low-fat dairy products, and lean protein. Do not eat a lot of foods that are high in solid fats, added sugars, or sodium. Maintain a healthy weight Body mass index (BMI) is a measurement that can be used to identify possible weight problems. It estimates body fat based on height and weight. Your health care provider can help determine your BMI and help you achieve or maintain a healthy weight. Get regular exercise Get regular exercise. This is one of the most important things you can do for your health. Most adults should: Exercise for at least 150 minutes each week. The exercise should increase your heart  rate and make you sweat (moderate-intensity exercise). Do strengthening exercises at least twice a week. This is in addition to the moderate-intensity exercise. Spend less time sitting. Even light physical activity can be beneficial. Watch cholesterol and blood lipids Have your blood tested for lipids and cholesterol at 43 years of age, then have this test every 5 years. You may need to have your cholesterol levels checked more often if: Your lipid or cholesterol levels are high. You are older than 43 years of age. You are at high risk for heart disease. What should I know about cancer screening? Many types of cancers can be detected early and may often be prevented. Depending on your health history and family history, you may need to have cancer screening at various ages. This may include screening for: Colorectal cancer. Prostate cancer. Skin cancer. Lung cancer. What should I know about heart disease, diabetes, and high blood pressure? Blood pressure and heart disease High blood pressure causes heart disease and increases the risk of stroke. This is more likely to develop in people who have high blood pressure readings or are overweight. Talk with your health care provider about your target blood pressure readings. Have your blood pressure checked: Every 3-5 years if you are 62-38 years of age. Every year if you are 51 years old or older. If you are between the ages of 52 and 73 and are a current or former smoker, ask your health care provider if you should have a one-time screening for  abdominal aortic aneurysm (AAA). Diabetes Have regular diabetes screenings. This checks your fasting blood sugar level. Have the screening done: Once every three years after age 23 if you are at a normal weight and have a low risk for diabetes. More often and at a younger age if you are overweight or have a high risk for diabetes. What should I know about preventing infection? Hepatitis B If you have a  higher risk for hepatitis B, you should be screened for this virus. Talk with your health care provider to find out if you are at risk for hepatitis B infection. Hepatitis C Blood testing is recommended for: Everyone born from [birth year] through 1965. Anyone with known risk factors for hepatitis C. Sexually transmitted infections (STIs) You should be screened each year for STIs, including gonorrhea and chlamydia, if: You are sexually active and are younger than 43 years of age. You are older than 43 years of age and your health care provider tells you that you are at risk for this type of infection. Your sexual activity has changed since you were last screened, and you are at increased risk for chlamydia or gonorrhea. Ask your health care provider if you are at risk. Ask your health care provider about whether you are at high risk for HIV. Your health care provider may recommend a prescription medicine to help prevent HIV infection. If you choose to take medicine to prevent HIV, you should first get tested for HIV. You should then be tested every 3 months for as long as you are taking the medicine. Follow these instructions at home: Alcohol use Do not drink alcohol if your health care provider tells you not to drink. If you drink alcohol: Limit how much you have to 0-2 drinks a day. Know how much alcohol is in your drink. In the [place] one drink equals one 12 oz bottle of beer (355 mL), one 5 oz glass of wine (148 mL), or one 1 oz glass of hard liquor (44 mL). Lifestyle Do not use any products that contain nicotine or tobacco. These products include cigarettes, chewing tobacco, and vaping devices, such as e-cigarettes. If you need help quitting, ask your health care provider. Do not use street drugs. Do not share needles. Ask your health care provider for help if you need support or information about quitting drugs. General instructions Schedule regular health, dental, and eye exams. Stay current  with your vaccines. Tell your health care provider if: You often feel depressed. You have ever been abused or do not feel safe at home. Summary Adopting a healthy lifestyle and getting preventive care are important in promoting health and wellness. Follow your health care provider's instructions about healthy diet, exercising, and getting tested or screened for diseases. Follow your health care provider's instructions on monitoring your cholesterol and blood pressure. This information is not intended to replace advice given to you by your health care provider. Make sure you discuss any questions you have with your health care provider. Document Revised: 06/17/2020 Document Reviewed: 06/17/2020 Elsevier Patient Education  2024 Elsevier Inc. American Heart Association [facility]) Exercise Recommendation  Being physically active is important to prevent heart disease and stroke, the nation's No. 1and No. 5killers. To improve overall cardiovascular health, we suggest at least 150 minutes per week of moderate exercise or 75 minutes per week of vigorous exercise (or a combination of moderate and vigorous activity). Thirty minutes a day, five times a week is an easy goal to remember. You will also  experience benefits even if you divide your time into two or three segments of 10 to 15 minutes per day.  For people who would benefit from lowering their blood pressure or cholesterol, we recommend 40 minutes of aerobic exercise of moderate to vigorous intensity three to four times a week to lower the risk for heart attack and stroke.  Physical activity is anything that makes you move your body and burn calories.  This includes things like climbing stairs or playing sports. Aerobic exercises benefit your heart, and include walking, jogging, swimming or biking. Strength and stretching exercises are best for overall stamina and flexibility.  The simplest, positive change you can make to effectively improve your  heart health is to start walking. It's enjoyable, free, easy, social and great exercise. A walking program is flexible and boasts high success rates because people can stick with it. It's easy for walking to become a regular and satisfying part of life.   For Overall Cardiovascular Health: At least 30 minutes of moderate-intensity aerobic activity at least 5 days per week for a total of 150  OR  At least 25 minutes of vigorous aerobic activity at least 3 days per week for a total of 75 minutes; or a combination of moderate- and vigorous-intensity aerobic activity  AND  Moderate- to high-intensity muscle-strengthening activity at least 2 days per week for additional health benefits.  For Lowering Blood Pressure and Cholesterol An average 40 minutes of moderate- to vigorous-intensity aerobic activity 3 or 4 times per week  What if I can't make it to the time goal? Something is always better than nothing! And everyone has to start somewhere. Even if you've been sedentary for years, today is the day you can begin to make healthy changes in your life. If you don't think you'll make it for 30 or 40 minutes, set a reachable goal for today. You can work up toward your overall goal by increasing your time as you get stronger. Don't let all-or-nothing thinking rob you of doing what you can every day.  Source:http://www.heart.org

## 2023-05-07 NOTE — Assessment & Plan Note (Addendum)
 Stable Atrial fibrillation with previous ablation. Occasional episodes resolve within ten seconds- not sure that these 'episodes' are true afib due to previous holter monitor results. No recent episodes in the last three to four months. Discussed that Alcohol may exacerbate symptoms ('holiday heart'). - Continue annual or biannual follow-ups at the AFib clinic. - Monitor for increased frequency of atrial fibrillation episodes. - Continue diltiazem 120mg  daily. - Decrease alcohol intake. - Continue to follow annually with afib clinic.

## 2023-05-07 NOTE — Assessment & Plan Note (Signed)
-   Managed with Nexium- continue. - Consider endoscopy with next colonoscopy to rule out Barrett's Esophagus. - Decrease alcohol intake.

## 2023-05-07 NOTE — Addendum Note (Signed)
 Addended by: Melissa Montane on: 05/07/2023 09:15 AM   Modules accepted: Level of Service

## 2023-05-08 LAB — COMPREHENSIVE METABOLIC PANEL WITH GFR
ALT: 22 IU/L (ref 0–44)
AST: 21 IU/L (ref 0–40)
Albumin: 4.7 g/dL (ref 4.1–5.1)
Alkaline Phosphatase: 112 IU/L (ref 44–121)
BUN/Creatinine Ratio: 14 (ref 9–20)
BUN: 13 mg/dL (ref 6–24)
Bilirubin Total: 0.2 mg/dL (ref 0.0–1.2)
CO2: 21 mmol/L (ref 20–29)
Calcium: 9.7 mg/dL (ref 8.7–10.2)
Chloride: 104 mmol/L (ref 96–106)
Creatinine, Ser: 0.94 mg/dL (ref 0.76–1.27)
Globulin, Total: 2.6 g/dL (ref 1.5–4.5)
Glucose: 101 mg/dL — ABNORMAL HIGH (ref 70–99)
Potassium: 4.7 mmol/L (ref 3.5–5.2)
Sodium: 141 mmol/L (ref 134–144)
Total Protein: 7.3 g/dL (ref 6.0–8.5)
eGFR: 104 mL/min/{1.73_m2} (ref >59)

## 2023-05-08 LAB — LIPID PANEL
Chol/HDL Ratio: 3 ratio (ref 0.0–5.0)
Cholesterol, Total: 192 mg/dL (ref 100–199)
HDL: 63 mg/dL (ref >39)
LDL Chol Calc (NIH): 107 mg/dL — ABNORMAL HIGH (ref 0–99)
Triglycerides: 127 mg/dL (ref 0–149)
VLDL Cholesterol Cal: 22 mg/dL (ref 5–40)

## 2023-05-08 LAB — CBC WITH DIFFERENTIAL/PLATELET
Basophils Absolute: 0 10*3/uL (ref 0.0–0.2)
Basos: 0 %
EOS (ABSOLUTE): 0.1 10*3/uL (ref 0.0–0.4)
Eos: 1 %
Hematocrit: 47.5 % (ref 37.5–51.0)
Hemoglobin: 16 g/dL (ref 13.0–17.7)
Immature Grans (Abs): 0 10*3/uL (ref 0.0–0.1)
Immature Granulocytes: 0 %
Lymphocytes Absolute: 1.2 10*3/uL (ref 0.7–3.1)
Lymphs: 9 %
MCH: 31.8 pg (ref 26.6–33.0)
MCHC: 33.7 g/dL (ref 31.5–35.7)
MCV: 94 fL (ref 79–97)
Monocytes Absolute: 0.6 10*3/uL (ref 0.1–0.9)
Monocytes: 4 %
Neutrophils Absolute: 10.7 10*3/uL — ABNORMAL HIGH (ref 1.4–7.0)
Neutrophils: 86 %
Platelets: 300 10*3/uL (ref 150–450)
RBC: 5.03 x10E6/uL (ref 4.14–5.80)
RDW: 12.5 % (ref 11.6–15.4)
WBC: 12.6 10*3/uL — ABNORMAL HIGH (ref 3.4–10.8)

## 2023-05-08 LAB — HEMOGLOBIN A1C
Est. average glucose Bld gHb Est-mCnc: 114 mg/dL
Hgb A1c MFr Bld: 5.6 % (ref 4.8–5.6)

## 2023-05-11 ENCOUNTER — Other Ambulatory Visit (HOSPITAL_BASED_OUTPATIENT_CLINIC_OR_DEPARTMENT_OTHER): Payer: Self-pay | Admitting: Student

## 2023-05-11 ENCOUNTER — Encounter (HOSPITAL_BASED_OUTPATIENT_CLINIC_OR_DEPARTMENT_OTHER): Payer: Self-pay | Admitting: Student

## 2023-05-11 DIAGNOSIS — R0681 Apnea, not elsewhere classified: Secondary | ICD-10-CM | POA: Insufficient documentation

## 2023-05-11 NOTE — Progress Notes (Signed)
 Patient messaged stating that his wife and kids reported that they have witnessed apneas and extremely loud snoring. Patient is age 43 and male. BMI is 36.63. He is being treated for Hypertension. Based on this information alone- Patient's STOP-BANG score is high risk for osa (Score: 5).

## 2023-05-31 ENCOUNTER — Encounter (HOSPITAL_BASED_OUTPATIENT_CLINIC_OR_DEPARTMENT_OTHER): Payer: Self-pay | Admitting: Student

## 2023-05-31 DIAGNOSIS — G4733 Obstructive sleep apnea (adult) (pediatric): Secondary | ICD-10-CM

## 2023-06-08 ENCOUNTER — Other Ambulatory Visit (HOSPITAL_BASED_OUTPATIENT_CLINIC_OR_DEPARTMENT_OTHER): Payer: Self-pay | Admitting: Student

## 2023-06-08 DIAGNOSIS — F411 Generalized anxiety disorder: Secondary | ICD-10-CM

## 2023-06-08 MED ORDER — SERTRALINE HCL 25 MG PO TABS
25.0000 mg | ORAL_TABLET | Freq: Every day | ORAL | 3 refills | Status: DC
Start: 2023-06-08 — End: 2023-06-10

## 2023-06-10 ENCOUNTER — Other Ambulatory Visit: Payer: Self-pay

## 2023-06-10 MED ORDER — SERTRALINE HCL 25 MG PO TABS
25.0000 mg | ORAL_TABLET | Freq: Every day | ORAL | 3 refills | Status: DC
Start: 2023-06-10 — End: 2023-10-12
  Filled 2023-06-10: qty 90, 90d supply, fill #0

## 2023-06-10 NOTE — Addendum Note (Signed)
 Addended by: Million Maharaj on: 06/10/2023 05:36 PM   Modules accepted: Orders

## 2023-06-22 ENCOUNTER — Other Ambulatory Visit: Payer: Self-pay

## 2023-06-28 ENCOUNTER — Other Ambulatory Visit (HOSPITAL_BASED_OUTPATIENT_CLINIC_OR_DEPARTMENT_OTHER): Payer: Self-pay | Admitting: Family Medicine

## 2023-06-28 ENCOUNTER — Other Ambulatory Visit: Payer: Self-pay

## 2023-06-28 DIAGNOSIS — E782 Mixed hyperlipidemia: Secondary | ICD-10-CM

## 2023-06-28 MED ORDER — PRAVASTATIN SODIUM 20 MG PO TABS
20.0000 mg | ORAL_TABLET | Freq: Every day | ORAL | 2 refills | Status: DC
  Filled 2023-06-28: qty 30, 30d supply, fill #0

## 2023-06-28 MED ORDER — PRAVASTATIN SODIUM 20 MG PO TABS
20.0000 mg | ORAL_TABLET | Freq: Every day | ORAL | 0 refills | Status: DC
Start: 2023-06-28 — End: 2023-06-29

## 2023-06-29 ENCOUNTER — Ambulatory Visit (HOSPITAL_BASED_OUTPATIENT_CLINIC_OR_DEPARTMENT_OTHER): Attending: Student | Admitting: Internal Medicine

## 2023-06-29 ENCOUNTER — Other Ambulatory Visit (HOSPITAL_BASED_OUTPATIENT_CLINIC_OR_DEPARTMENT_OTHER): Payer: Self-pay

## 2023-06-29 ENCOUNTER — Other Ambulatory Visit: Payer: Self-pay

## 2023-06-29 VITALS — Ht 73.0 in | Wt 273.0 lb

## 2023-06-29 DIAGNOSIS — G4733 Obstructive sleep apnea (adult) (pediatric): Secondary | ICD-10-CM | POA: Diagnosis not present

## 2023-06-29 DIAGNOSIS — R0681 Apnea, not elsewhere classified: Secondary | ICD-10-CM

## 2023-06-29 DIAGNOSIS — E782 Mixed hyperlipidemia: Secondary | ICD-10-CM

## 2023-06-29 MED ORDER — PRAVASTATIN SODIUM 20 MG PO TABS
20.0000 mg | ORAL_TABLET | Freq: Every day | ORAL | 0 refills | Status: DC
  Filled 2023-06-29: qty 90, 90d supply, fill #0

## 2023-07-03 DIAGNOSIS — R0681 Apnea, not elsewhere classified: Secondary | ICD-10-CM

## 2023-07-03 NOTE — Procedures (Signed)
 [facility] [address] [place], [place] [address] Tel: [phone]   Fax: [phone]  Polysomnography Interpretation  Patient Name:  Alexander James, Alexander James Date:  06/29/2023 Referring Physician:  Derwood Flor T. Rothfuss, PA-C  Indications for Polysomnography The patient is a 43 year old Male who is 6\' 1"  and weighs 273.0 lbs. His BMI equals 36.2.  A full night polysomnogram was performed to evaluate for -.OSA  No medication was taken during the night.  No Data.   Polysomnogram Data A full night polysomnogram recorded the standard physiologic parameters including EEG, EOG, EMG, EKG, nasal and oral airflow.  Respiratory parameters of chest and abdominal movements were recorded with Respiratory Inductance Plethysmography belts.  Oxygen saturation was recorded by pulse oximetry.   Sleep Architecture The total recording time of the polysomnogram was 426.7 minutes.  The total sleep time was 378.5 minutes.  The patient spent 5.3% of total sleep time in Stage N1, 58.1% in Stage N2, 0.0% in Stages N3, and 36.6% in REM.  Sleep latency was 26.1 minutes.  REM latency was 62.0 minutes.  Sleep Efficiency was 88.7%.  Wake after Sleep Onset time was 22.0 minutes.  Respiratory Events The polysomnogram revealed a presence of - obstructive, - central, and - mixed apneas resulting in an Apnea index of - events per hour.  There were 162 hypopneas (>=3% desaturation and/or arousal) resulting in an Apnea\Hypopnea Index (AHI >=3% desaturation and/or arousal) of 25.7 events per hour.  There were 105 hypopneas (>=4% desaturation) resulting in an Apnea\Hypopnea Index (AHI >=4% desaturation) of 16.6 events per hour.  There were 65 Respiratory Effort Related Arousals resulting in a RERA index of 10.3 events per hour. The Respiratory Disturbance Index is 36.0 events per hour.  The snore index was 513.9 events per hour.  Mean oxygen saturation was 91.1%.  The lowest oxygen  saturation during sleep was 81.0%.  Time spent <=88% oxygen saturation was 24.7 minutes (5.9%).  End Tidal CO2 during sleep ranged from - to - mmHg. End Tidal CO2 was greater than 50 mmHg for - minutes and greater than 55 mmHg for - minutes.  Limb Activity There were - total limb movements recorded, of this total, - were classified as PLMs.  PLM index was - per hour and PLM associated with Arousals index was - per hour.  Cardiac Summary The average pulse rate was 63.6 bpm.  The minimum pulse rate was 53.0 bpm while the maximum pulse rate was 81.0 bpm.  Cardiac rhythm was normal.  Comment: Moderate obstructive sleep apnea, AHI (4%) 16.6/hr. Snoring with oxygen desaturation to a nadir of 81%, mean 91.1%.  Diagnosis: Obstructive sleep apnea  Recommendations: Suggest CPAP titration sleep study or autopap. Other options would be based on clinical judgment.   This study was personally reviewed and electronically signed by: Dr.Yoland Scherr Accredited Board Certified in Sleep Medicine Date/Time: 07/03/23   12: 46        Diagnostic PSG Report  Patient Name: Alexander James, [name] Date: 06/29/2023  Date of Birth: 02-07-1981 Study Type: Diagnostic  Age: 87 year MRN #: [id number]  Sex: Male Interpreting Physician: [employer] [id number]  Height: 6\' 1"  Referring Physician: Laron Plummer. Rothfuss, PA-C  Weight: 273.0 lbs Recording Tech: Roosvelt Colla RRT RPSGT RST  BMI: 36.2 Scoring Tech: Roosvelt Colla RRT RPSGT RST  ESS: 8 Neck Size: 17.5   Study Overview  Lights Off: 09:42:51 PM  Count Index  Lights On: 04:49:32 AM Awakenings: 13 2.1  Time in Bed: 426.7 min. Arousals: 186 29.5  Total Sleep Time: 378.5 min. AHI (>=3% Desat and/or Ar.): 162 25.7   Sleep Efficiency: 88.7% AHI (>=4% Desat): 105 16.6   Sleep Latency: 26.1 min. Limb Movements: - -  Wake After Sleep Onset: 22.0 min. Snore: 3242 513.9  REM Latency from Sleep Onset: 62.0 min. Desaturations: 165 26.2     Minimum SpO2 TST: 81.0%     Sleep Architecture  % of Time in Bed Stages Time (mins) % Sleep Time  Wake 49.0   Stage N1 20.0 5.3%  Stage N2 220.0 58.1%  Stage N3 0.0 0.0%  REM 138.5 36.6%   Arousal Summary   NREM REM Sleep Index  Respiratory Arousals 80 48 128 20.3  PLM Arousals - - - -  Isolated Limb Movement Arousals - - - -  Snore Arousals 29 5 34 5.4  Spontaneous Arousals 16 8 24  3.8  Total 125 61 186 29.5   Limb Movement Summary   Count Index  Isolated Limb Movements - -  Periodic Limb Movements (PLMs) - -  Total Limb Movements - -    Respiratory Summary   By Sleep Stage By Body Position Total   NREM REM Supine Non-Supine   Time (min) 240.0 138.5 16.5 362.0 378.5         Obstructive Apnea - - - - -  Mixed Apnea - - - - -  Central Apnea - - - - -  Total Apneas - - - - -  Total Apnea Index - - - - -         Hypopneas (>=3% Desat and/or Ar.) 64 98 28 134 162  AHI (>=3% Desat and/or Ar.) 16.0 42.5 101.8 22.2 25.7         Hypopneas (>=4% Desat) 32 73 26 79 105  AHI (>=4% Desat) 8.0 31.6 94.5 13.1 16.6          RERAs 50 15 2 63 65  RERA Index 12.5 6.5 7.3 10.4 10.3         RDI 28.5 49.0 109.1 32.7 36.0    Respiratory Event Type Index  Central Apneas -  Obstructive Apneas -  Mixed Apneas -  Central Hypopneas -  Obstructive Hypopneas 25.7  Central Apnea + Hypopnea (CAHI) -  Obstructive Apnea + Hypopnea (OAHI) 25.7   Respiratory Event Durations   Apnea Hypopnea   NREM REM NREM REM  Average (seconds) - - 24.9 30.6  Maximum (seconds) - - 94.9 83.5    Oxygen Saturation Summary   Wake NREM REM TST TIB  Average SpO2 (%) 92.3% 90.7% 91.5% 91.0% 91.1%  Minimum SpO2 (%) 83.0% 84.0% 81.0% 81.0% 81.0%  Maximum SpO2 (%) 97.0% 97.0% 98.0% 98.0% 98.0%   Oxygen Saturation Distribution  Range (%) Time in range (min) Time in range (%)  90.0 - 100.0 265.0 62.8%  80.0 - 90.0 157.1 37.2%  70.0 - 80.0 - -  60.0 - 70.0 - -  50.0 - 60.0 - -  0.0 - 50.0 - -  Time Spent <=88%  SpO2  Range (%) Time in range (min) Time in range (%)  0.0 - 88.0 24.7 5.9%      Count Index  Desaturations 165 26.2    Cardiac Summary   Wake NREM REM Sleep Total  Average Pulse Rate (BPM) 66.2 63.4 63.1 63.3 63.6  Minimum Pulse Rate (BPM) 56.0 54.0 53.0 53.0 53.0  Maximum Pulse Rate (BPM) 81.0 81.0 81.0 81.0 81.0  Pulse Rate Distribution:  Range (bpm) Time in range (min) Time in range (%)  0.0 - 40.0 - -  40.0 - 60.0 64.0 15.1%  60.0 - 80.0 357.3 84.6%  80.0 - 100.0 0.2 0.0%  100.0 - 120.0 - -  120.0 - 140.0 - -  140.0 - 200.0 - -   EtCO2 Summary  Stage Min (mmHg) Average (mmHg) Max (mmHg)  Wake - - -  NREM(1+2+3) - - -  REM - - -   EtCO2 Distribution:  Range (mmHg) Time in range (min) Time in range (%)  20.0 - 40.0 - -  40.0 - 50.0 - -  50.0 - 100.0 - -  55.0 - 100.0 - -  Excluded data <20.0 & >65.0 427.5 100.0%     Hypnograms                         Technologist Comments  Patient was ordered as a NPSG. Patient is a 43 year old white male who was sent to the sleep center for OSA. No oxygen was applied. No medications were reported taken. No PLM'S/PLMA'S were noted. No obvious cardiac arrhythmias were noted. One restroom visit was noted.                           [employer] Diplomate, Biomedical engineer of Sleep Medicine  ELECTRONICALLY SIGNED ON:  07/03/2023, 12:42 PM [facility] PH: [phone]   FX: [phone] ACCREDITED BY THE AMERICAN ACADEMY OF SLEEP MEDICINE

## 2023-07-08 NOTE — Telephone Encounter (Signed)
Called and left message for patient to call office and schedule appointment

## 2023-07-08 NOTE — Telephone Encounter (Signed)
 Spoke to patient wife and he would like to continue his care with Dr. Clarnce Crow, also would like CPAP ordered.

## 2023-07-09 ENCOUNTER — Ambulatory Visit (HOSPITAL_BASED_OUTPATIENT_CLINIC_OR_DEPARTMENT_OTHER): Admitting: Family Medicine

## 2023-07-12 ENCOUNTER — Encounter (HOSPITAL_BASED_OUTPATIENT_CLINIC_OR_DEPARTMENT_OTHER): Payer: Self-pay | Admitting: Family Medicine

## 2023-07-12 ENCOUNTER — Other Ambulatory Visit: Payer: Self-pay

## 2023-07-12 ENCOUNTER — Ambulatory Visit (INDEPENDENT_AMBULATORY_CARE_PROVIDER_SITE_OTHER): Admitting: Family Medicine

## 2023-07-12 VITALS — BP 171/112 | HR 78 | Temp 98.3°F | Resp 16 | Ht 73.0 in | Wt 273.0 lb

## 2023-07-12 DIAGNOSIS — F419 Anxiety disorder, unspecified: Secondary | ICD-10-CM | POA: Insufficient documentation

## 2023-07-12 DIAGNOSIS — G4733 Obstructive sleep apnea (adult) (pediatric): Secondary | ICD-10-CM | POA: Diagnosis not present

## 2023-07-12 DIAGNOSIS — I160 Hypertensive urgency: Secondary | ICD-10-CM | POA: Insufficient documentation

## 2023-07-12 MED ORDER — ESCITALOPRAM OXALATE 10 MG PO TABS
10.0000 mg | ORAL_TABLET | Freq: Every day | ORAL | 0 refills | Status: DC
Start: 2023-07-12 — End: 2023-10-12
  Filled 2023-07-12: qty 90, 90d supply, fill #0

## 2023-07-12 NOTE — Assessment & Plan Note (Signed)
 Await upcoming repeat sleep study.  Request old records.  Reduce or stop alcohol intake.  He declines additional BP rx for now, but agrees to update me on his home BP tomorrow after being perhaps more relaxed.  DASH diet reviewed.

## 2023-07-12 NOTE — Progress Notes (Signed)
 Established Patient Office Visit  Subjective   Patient ID: Alexander James, male    DOB: 16-Feb-1980  Age: 43 y.o. MRN: [id number]  Chief Complaint  Patient presents with   Medication Refill    Pt. Will like to discuss medication Pristiq. Pt. Stats of getting back on Lexapro .     F/u as above.  Stopped his Sertraline  about 5 days ago after gaining weight and getting frustrated.  He and his wife are eager to get him back on Lexapro .  Has been too irritable w/o it.  Admits he is drinking more beer than he should and we reviewed the need to cut that back.  He has an upcoming repeat sleep study and will need consultation with the sleep specialist about the latest treatment options.  Medication Refill Pertinent negatives include no chest pain, coughing, diaphoresis or fever.    Past Medical History:  Diagnosis Date   Essential hypertension 06/09/2019   Family hx of colon cancer    father   GERD (gastroesophageal reflux disease)    Morbid obesity (HCC)    Comorbidities:  HTN, PAF   OSA (obstructive sleep apnea)    Paroxysmal A-fib (HCC) 06/09/2019    Outpatient Encounter Medications as of 07/12/2023  Medication Sig   diltiazem  (CARTIA  XT) 120 MG 24 hr capsule Take 1 capsule (120 mg total) by mouth daily.   escitalopram  (LEXAPRO ) 10 MG tablet Take 1 tablet (10 mg total) by mouth daily.   esomeprazole  (NEXIUM ) 40 MG capsule Take 1 capsule (40 mg total) by mouth every morning.   Multiple Vitamins-Minerals (MULTIVITAMIN GUMMIES ADULT PO) Take 2 tablets by mouth daily.   pravastatin  (PRAVACHOL ) 20 MG tablet Take 1 tablet (20 mg total) by mouth at bedtime.   pravastatin  (PRAVACHOL ) 20 MG tablet Take 1 tablet (20 mg total) by mouth at bedtime.   diclofenac  (VOLTAREN ) 75 MG EC tablet Take 1 tablet (75 mg total) by mouth 2 (two) times daily. (Patient not taking: Reported on 07/12/2023)   sertraline  (ZOLOFT ) 25 MG tablet Take 1 tablet (25 mg total) by mouth daily. (Patient not taking: Reported on  07/12/2023)   No facility-administered encounter medications on file as of 07/12/2023.    Social History   Tobacco Use   Smoking status: Never    Passive exposure: Never   Smokeless tobacco: Never  Vaping Use   Vaping status: Never Used  Substance Use Topics   Alcohol use: Not Currently    Comment: usually drinks 3-4 beers a day   Drug use: No      Review of Systems  Constitutional:  Negative for diaphoresis, fever, malaise/fatigue and weight loss.  Respiratory:  Negative for cough, shortness of breath and wheezing.   Cardiovascular:  Negative for chest pain, palpitations, orthopnea, claudication, leg swelling and PND.  Psychiatric/Behavioral:  Negative for depression, hallucinations, memory loss and suicidal ideas. The patient is nervous/anxious.       Objective:     BP (!) 171/112 (BP Location: Left Arm, Patient Position: Standing, Cuff Size: Large)   Pulse 78   Temp 98.3 F (36.8 C) (Oral)   Resp 16   Ht 6\' 1"  (1.854 m)   Wt 273 lb (123.8 kg)   SpO2 95%   BMI 36.02 kg/m    Physical Exam Constitutional:      General: He is not in acute distress.    Appearance: Normal appearance. He is obese.  HENT:     Head: Normocephalic.  Neck:  Vascular: No carotid bruit.  Cardiovascular:     Rate and Rhythm: Normal rate and regular rhythm.     Pulses: Normal pulses.     Heart sounds: Normal heart sounds.  Pulmonary:     Effort: Pulmonary effort is normal.     Breath sounds: Normal breath sounds.  Abdominal:     General: Bowel sounds are normal.     Palpations: Abdomen is soft.  Musculoskeletal:     Cervical back: Neck supple. No tenderness.     Right lower leg: No edema.     Left lower leg: No edema.  Neurological:     Mental Status: He is alert.      No results found for any visits on 07/12/23.    The 10-year ASCVD risk score (Arnett DK, et al., 2019) is: 1.9%    Assessment & Plan:  OSA on CPAP Assessment & Plan: Await upcoming repeat sleep  study.  Request old records.  Reduce or stop alcohol intake.  He declines additional BP rx for now, but agrees to update me on his home BP tomorrow after being perhaps more relaxed.  DASH diet reviewed.   Anxiety -     Escitalopram  Oxalate; Take 1 tablet (10 mg total) by mouth daily.  Dispense: 90 tablet; Refill: 0  Asymptomatic hypertensive urgency    Return in about 3 months (around 10/12/2023) for physical.    Clarnce Crow Reece Cane., MD

## 2023-07-14 ENCOUNTER — Telehealth: Payer: Self-pay | Admitting: *Deleted

## 2023-07-14 NOTE — Telephone Encounter (Signed)
 Copied from CRM [id number]. Topic: Appointments - Scheduling Inquiry for Clinic >> Jul 14, 2023  3:03 PM Alverda Joe S wrote: Reason for CRM: patient wife is calling because patient have bad sleep apnea and wife is concerned for his health, the first available appointment is for August and she dont think he should wait that long, she is asking please is there something they can do to get this process done sooner, or is there another way they can get a cpap machine. Please call or message patient at [phone](Ashley Hammac)  Called and spoke with patient's wife (DPR), she was asking for a sooner appointment because her husband had a sleep study on 06/29/23 and recently found out the results, he has sleep apnea.  She was concerned about him waiting until August to get started on CPAP therapy.  I advised her I could get him in with a NP earlier than an MD and they do sleep consults as well.  I scheduled him for 08/10/23 at 9 am, advised to arrive by 8:45 am for check in and would mail them the new patient paperwork so they can complete it at home and bring it with them the day of his appointment.  She verbalized understanding.  Paperwork mailed to verified address.  Nothing further needed.

## 2023-07-15 ENCOUNTER — Telehealth: Payer: Self-pay

## 2023-07-15 NOTE — Telephone Encounter (Signed)
 Copied from CRM [id number]. Topic: General - Other >> Jul 14, 2023  8:59 AM Margarette Shawl wrote: Reason for CRM:   Pt's wife Odilia Bennett contacted clinic to schedule New pt appt with Dr Linder Revere, for CPAP initiation after sleep study. Soonest appt was 08/02, and wife is concerned with waiting this long.  Requesting call back to see if pt's PCP can order the CPAP machine, or if it needs to be done by pulmonology and wait for an appt.  CB#  [phone](Ashley Closson)  This encounter was handled yesterday (see telephone encounter 07/14/2023).  Patient has OV 08/10/2023 with Roena Clark, NP.

## 2023-07-29 ENCOUNTER — Other Ambulatory Visit: Payer: Self-pay

## 2023-08-10 ENCOUNTER — Ambulatory Visit (INDEPENDENT_AMBULATORY_CARE_PROVIDER_SITE_OTHER): Admitting: Adult Health

## 2023-08-10 ENCOUNTER — Encounter: Payer: Self-pay | Admitting: Adult Health

## 2023-08-10 VITALS — BP 160/110 | HR 83 | Ht 73.0 in | Wt 285.2 lb

## 2023-08-10 DIAGNOSIS — G4733 Obstructive sleep apnea (adult) (pediatric): Secondary | ICD-10-CM

## 2023-08-10 NOTE — Patient Instructions (Signed)
 Begin CPAP At bedtime , wear all night long  Do not drive if sleepy  Work on healthy weight loss  Follow up in 3 months and As needed

## 2023-08-10 NOTE — Progress Notes (Signed)
 @Patient  ID: Alexander James, male    DOB: 1981/01/21, 43 y.o.   MRN: [id number]  Chief Complaint  Patient presents with   Consult    Referring provider: Dottie Norleen PHEBE PONCE, MD  HPI: 43 yo male seen for sleep consult 08/10/2023 for recently diagnosed sleep apnea.   TEST/EVENTS :   08/10/2023  Discussed the use of AI scribe software for clinical note transcription with the patient, who gave verbal consent to proceed.  History of Present Illness   Alexander James is a 43 year old male with sleep apnea who presents for management of his condition. He was referred by his primary doctor for evaluation of sleep apnea.  He experiences loud snoring and restless sleep, as noted by his wife. He often feels tired in the morning and during the day, particularly when driving. His sleep schedule typically involves going to bed around 9 PM and waking up at 6 AM, but he wakes up two to three times a night. An in lab  sleep study conducted in Jun 29, 2023 revealed moderate sleep apnea with AHI 16/hr and oxygen levels dropping to 81%. Wakes up tired, sleep is restless. Does not nap. 4 glasses caffeine daily. No history of stroke or CHF.   He has a history of atrial fibrillation, for which he underwent a heart ablation in the past.  He experiences occasional episodes of fluttering lasting 30 to 45 seconds, occurring every few months.   His weight is currently 285 pounds/BMI 37, which is higher than his usual weight, and he finds it difficult to lose weight. He consumes two to three Cheerwine Zeros and a glass of sweet tea daily, but does not drink coffee. He drinks alcohol a couple of nights a week, consuming two beers each time, and four to five beers on weekends. He does not smoke or use drugs.  His family history includes sleep apnea in his brother and possibly his father, who passed away from colon cancer. His maternal grandfather died of a massive heart attack at 30. There is no family history of atrial  fibrillation, thyroid  cancer, or other cancers, except for melanoma in his grandmother and mother.  He works in the [employer] business and holds a [employer], for which he undergoes regular physicals. He has not had any car accidents due to sleepiness but notes feeling tired on long trips, during which he takes breaks.       No Known Allergies  Immunization History  Administered Date(s) Administered   Influenza, Quadrivalent, Recombinant, Inj, Pf 11/04/2022   Tdap 05/23/2021    Past Medical History:  Diagnosis Date   Essential hypertension 06/09/2019   Family hx of colon cancer    father   GERD (gastroesophageal reflux disease)    Morbid obesity (HCC)    Comorbidities:  HTN, PAF   OSA (obstructive sleep apnea)    Paroxysmal A-fib (HCC) 06/09/2019    Tobacco History: Social History   Tobacco Use  Smoking Status Never   Passive exposure: Never  Smokeless Tobacco Never   Counseling given: Not Answered   Outpatient Medications Prior to Visit  Medication Sig Dispense Refill   diclofenac  (VOLTAREN ) 75 MG EC tablet Take 1 tablet (75 mg total) by mouth 2 (two) times daily. 60 tablet 0   diltiazem  (CARTIA  XT) 120 MG 24 hr capsule Take 1 capsule (120 mg total) by mouth daily. 90 capsule 3   escitalopram  (LEXAPRO ) 10 MG tablet Take 1 tablet (10  mg total) by mouth daily. 90 tablet 0   esomeprazole  (NEXIUM ) 40 MG capsule Take 1 capsule (40 mg total) by mouth every morning. 90 capsule 3   Multiple Vitamins-Minerals (MULTIVITAMIN GUMMIES ADULT PO) Take 2 tablets by mouth daily.     pravastatin  (PRAVACHOL ) 20 MG tablet Take 1 tablet (20 mg total) by mouth at bedtime. 90 tablet 0   sertraline  (ZOLOFT ) 25 MG tablet Take 1 tablet (25 mg total) by mouth daily. (Patient not taking: Reported on 07/12/2023) 30 tablet 3   No facility-administered medications prior to visit.     Review of Systems:   Constitutional:   No  weight loss, night sweats,  Fevers, chills,+ fatigue, or   lassitude.  HEENT:   No headaches,  Difficulty swallowing,  Tooth/dental problems, or  Sore throat,                No sneezing, itching, ear ache, nasal congestion, post nasal drip,   CV:  No chest pain,  Orthopnea, PND, swelling in lower extremities, anasarca, dizziness, palpitations, syncope.   GI  No heartburn, indigestion, abdominal pain, nausea, vomiting, diarrhea, change in bowel habits, loss of appetite, bloody stools.   Resp: No shortness of breath with exertion or at rest.  No excess mucus, no productive cough,  No non-productive cough,  No coughing up of blood.  No change in color of mucus.  No wheezing.  No chest wall deformity  Skin: no rash or lesions.  GU: no dysuria, change in color of urine, no urgency or frequency.  No flank pain, no hematuria   MS:  No joint pain or swelling.  No decreased range of motion.  No back pain.    Physical Exam  BP (!) 160/100 (BP Location: Left Arm, Patient Position: Sitting, Cuff Size: Large)   Pulse 83   Ht 6' 1 (1.854 m)   Wt 285 lb 3.2 oz (129.4 kg)   SpO2 98%   BMI 37.63 kg/m   GEN: A/Ox3; pleasant , NAD, well nourished    HEENT:  [place]/AT,   NOSE-clear, THROAT-clear, no lesions, no postnasal drip or exudate noted. Class 3-4 MP airway   NECK:  Supple w/ fair ROM; no JVD; normal carotid impulses w/o bruits; no thyromegaly or nodules palpated; no lymphadenopathy.    RESP  Clear  P & A; w/o, wheezes/ rales/ or rhonchi. no accessory muscle use, no dullness to percussion  CARD:  RRR, no m/r/g, no peripheral edema, pulses intact, no cyanosis or clubbing.  GI:   Soft & nt; nml bowel sounds; no organomegaly or masses detected.   Musco: Warm bil, no deformities or joint swelling noted.   Neuro: alert, no focal deficits noted.    Skin: Warm, no lesions or rashes    Lab Results:  CBC    Component Value Date/Time   WBC 12.6 (H) 05/07/2023 0000   WBC 6.4 05/23/2021 0840   RBC 5.03 05/07/2023 0000   RBC 5.01 05/23/2021 0840    HGB 16.0 05/07/2023 0000   HCT 47.5 05/07/2023 0000   PLT 300 05/07/2023 0000   MCV 94 05/07/2023 0000   MCH 31.8 05/07/2023 0000   MCH 30.9 05/23/2021 0840   MCHC 33.7 05/07/2023 0000   MCHC 35.1 05/23/2021 0840   RDW 12.5 05/07/2023 0000   LYMPHSABS 1.2 05/07/2023 0000   EOSABS 0.1 05/07/2023 0000   BASOSABS 0.0 05/07/2023 0000    BMET    Component Value Date/Time   NA 141 05/07/2023 0000  K 4.7 05/07/2023 0000   CL 104 05/07/2023 0000   CO2 21 05/07/2023 0000   GLUCOSE 101 (H) 05/07/2023 0000   GLUCOSE 96 05/23/2021 0840   BUN 13 05/07/2023 0000   CREATININE 0.94 05/07/2023 0000   CALCIUM 9.7 05/07/2023 0000   GFRNONAA >60 05/23/2021 0840   GFRAA 128 10/12/2019 0814    BNP No results found for: BNP  ProBNP No results found for: PROBNP  Imaging: No results found.  Administration History     None           No data to display          No results found for: NITRICOXIDE      Assessment & Plan:   No problem-specific Assessment & Plan notes found for this encounter. Assessment and Plan    Moderate obstructive sleep apnea   A sleep study in May 2025  confirmed moderate obstructive sleep apnea with 16 apnea episodes per hour and oxygen desaturation to 81%. Symptoms include loud snoring, restless sleep, daytime fatigue, and difficulty losing weight.  Patient education given.  Initiate CPAP therapy at bedtime and educate on its use and maintenance. Auto CPAP 5-15cmH2o.  Order CPAP equipment and a nasal mask. Advise on using nasal saline spray and gel to prevent dryness. Discuss weight loss strategies and consider Zepbound injection. 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 - discussed how weight can impact sleep and risk for sleep disordered breathing - discussed options to assist with weight loss: combination of diet modification, cardiovascular and strength training exercises   - had an extensive discussion regarding the adverse health  consequences related to untreated sleep disordered breathing - specifically discussed the risks for hypertension, coronary artery disease, cardiac dysrhythmias, cerebrovascular disease, and diabetes - lifestyle modification discussed   - discussed how sleep disruption can increase risk of accidents, particularly when driving - safe driving practices were discussed     Hypertension   Hypertension may be exacerbated by sleep apnea due to intermittent hypoxia and increased sympathetic activity. CPAP therapy is expected to improve blood pressure control by reducing apnea episodes and improving oxygenation. Monitor blood pressure response to CPAP therapy.  Atrial fibrillation   Atrial fibrillation with previous ablation Sleep apnea may contribute to arrhythmias due to low oxygen levels affecting cardiac electrical activity. CPAP therapy is anticipated to reduce arrhythmia frequency by improving oxygenation during sleep. Monitor atrial fibrillation symptoms and response to CPAP therapy.  Anxiety   Anxiety is managed with Lexapro . Sleep apnea may exacerbate anxiety symptoms due to sleep disruption and low oxygen levels. CPAP therapy is expected to improve anxiety by enhancing sleep quality and oxygenation. Continue Lexapro  and monitor anxiety symptoms and response to CPAP therapy.  Gastroesophageal reflux disease (GERD)   GERD is a chronic condition that may be worsened by sleep apnea due to nocturnal arousals and increased intra-abdominal pressure during apnea episodes. CPAP therapy may provide indirect benefits by improving sleep quality and reducing arousals. Monitor GERD symptoms and response to CPAP therapy.  Morbid Obesity-healthy weight loss and exercise  Patient education on Zepbound approved medication for weight loss with OSA. Discuss in more detail on return office visit.   General Health Maintenance   Maintaining overall health through weight management and lifestyle modifications is  important. Encourage lifestyle modifications including diet and exercise. Advise reduction in caffeine and alcohol intake to support weight loss and improve sleep quality.          Madelin Stank, NP 08/10/2023

## 2023-08-16 ENCOUNTER — Other Ambulatory Visit (HOSPITAL_BASED_OUTPATIENT_CLINIC_OR_DEPARTMENT_OTHER): Payer: Self-pay | Admitting: Family Medicine

## 2023-08-16 DIAGNOSIS — I10 Essential (primary) hypertension: Secondary | ICD-10-CM

## 2023-08-16 MED ORDER — LISINOPRIL 10 MG PO TABS
10.0000 mg | ORAL_TABLET | Freq: Every day | ORAL | 1 refills | Status: DC
Start: 2023-08-16 — End: 2023-08-19

## 2023-08-18 DIAGNOSIS — F4320 Adjustment disorder, unspecified: Secondary | ICD-10-CM | POA: Diagnosis not present

## 2023-08-19 ENCOUNTER — Other Ambulatory Visit (HOSPITAL_BASED_OUTPATIENT_CLINIC_OR_DEPARTMENT_OTHER): Payer: Self-pay | Admitting: Family Medicine

## 2023-08-19 ENCOUNTER — Other Ambulatory Visit: Payer: Self-pay

## 2023-08-19 DIAGNOSIS — I10 Essential (primary) hypertension: Secondary | ICD-10-CM

## 2023-08-19 MED ORDER — LISINOPRIL 10 MG PO TABS
10.0000 mg | ORAL_TABLET | Freq: Every day | ORAL | 1 refills | Status: DC
  Filled 2023-08-19: qty 60, 60d supply, fill #0

## 2023-08-31 DIAGNOSIS — G4733 Obstructive sleep apnea (adult) (pediatric): Secondary | ICD-10-CM | POA: Diagnosis not present

## 2023-09-01 DIAGNOSIS — F4320 Adjustment disorder, unspecified: Secondary | ICD-10-CM | POA: Diagnosis not present

## 2023-09-15 DIAGNOSIS — F4320 Adjustment disorder, unspecified: Secondary | ICD-10-CM | POA: Diagnosis not present

## 2023-09-20 ENCOUNTER — Other Ambulatory Visit (HOSPITAL_BASED_OUTPATIENT_CLINIC_OR_DEPARTMENT_OTHER): Payer: Self-pay | Admitting: Family Medicine

## 2023-09-20 DIAGNOSIS — E782 Mixed hyperlipidemia: Secondary | ICD-10-CM

## 2023-09-20 MED ORDER — PRAVASTATIN SODIUM 20 MG PO TABS
20.0000 mg | ORAL_TABLET | Freq: Every day | ORAL | 0 refills | Status: DC
  Filled 2023-10-12: qty 90, 90d supply, fill #0

## 2023-09-22 ENCOUNTER — Telehealth: Payer: Self-pay | Admitting: *Deleted

## 2023-09-22 NOTE — Telephone Encounter (Signed)
 Received CMN from [facility].  Signed by Madelin Stank NP and faxed to DME.  Received confirmation that it was sent successfully.  Sent to scan.  Nothing further needed.

## 2023-09-29 DIAGNOSIS — F4320 Adjustment disorder, unspecified: Secondary | ICD-10-CM | POA: Diagnosis not present

## 2023-09-30 ENCOUNTER — Ambulatory Visit: Payer: 59 | Admitting: Dermatology

## 2023-09-30 ENCOUNTER — Encounter: Payer: Self-pay | Admitting: Dermatology

## 2023-09-30 VITALS — BP 166/94

## 2023-09-30 DIAGNOSIS — L814 Other melanin hyperpigmentation: Secondary | ICD-10-CM | POA: Diagnosis not present

## 2023-09-30 DIAGNOSIS — D23122 Other benign neoplasm of skin of left lower eyelid, including canthus: Secondary | ICD-10-CM

## 2023-09-30 DIAGNOSIS — W908XXA Exposure to other nonionizing radiation, initial encounter: Secondary | ICD-10-CM | POA: Diagnosis not present

## 2023-09-30 DIAGNOSIS — D23121 Other benign neoplasm of skin of left upper eyelid, including canthus: Secondary | ICD-10-CM | POA: Diagnosis not present

## 2023-09-30 DIAGNOSIS — D2339 Other benign neoplasm of skin of other parts of face: Secondary | ICD-10-CM | POA: Diagnosis not present

## 2023-09-30 DIAGNOSIS — D2239 Melanocytic nevi of other parts of face: Secondary | ICD-10-CM | POA: Diagnosis not present

## 2023-09-30 DIAGNOSIS — D1801 Hemangioma of skin and subcutaneous tissue: Secondary | ICD-10-CM

## 2023-09-30 DIAGNOSIS — D485 Neoplasm of uncertain behavior of skin: Secondary | ICD-10-CM

## 2023-09-30 DIAGNOSIS — Z1283 Encounter for screening for malignant neoplasm of skin: Secondary | ICD-10-CM | POA: Diagnosis not present

## 2023-09-30 DIAGNOSIS — L578 Other skin changes due to chronic exposure to nonionizing radiation: Secondary | ICD-10-CM | POA: Diagnosis not present

## 2023-09-30 DIAGNOSIS — L821 Other seborrheic keratosis: Secondary | ICD-10-CM | POA: Diagnosis not present

## 2023-09-30 DIAGNOSIS — D229 Melanocytic nevi, unspecified: Secondary | ICD-10-CM

## 2023-09-30 NOTE — Progress Notes (Signed)
 Total Body Skin Exam (TBSE) Visit   Subjective  Alexander James is a 43 y.o. male ESTABLISHED PATIENT who presents for the following:  Total Body Skin Exam (TBSE)  Patient was last evaluated for TBSE on 09/30/22 .  Patient does have spots of concern to be evaluated. He does apply sunscreen and/or wears protective coverings. Denied Hx of Bx. Reports family Hx of skin cancers (mom and grand mom).   The patient has spots, moles and lesions to be evaluated, some may be new or changing and the patient has concerns that these could be cancer.  The following portions of the chart were reviewed this encounter and updated as appropriate: medications, allergies, medical history  Review of Systems:  No other skin or systemic complaints except as noted in HPI or Assessment and Plan.  Objective  Well appearing patient in no apparent distress; mood and affect are within normal limits.  A full examination was performed including scalp, head, eyes, ears, nose, lips, neck, chest, axillae, abdomen, back, buttocks, bilateral upper extremities, bilateral lower extremities, hands, feet, fingers, toes, fingernails, and toenails. All findings within normal limits unless otherwise noted below.   Relevant physical exam findings are noted in the Assessment and Plan.  Left Medial canthus 4mm pink papule Left Nasal Sidewall 8mm pink papule Left Lower Leg - Posterior 5mm red papule  Assessment & Plan   LENTIGINES, SEBORRHEIC KERATOSES, HEMANGIOMAS - Benign normal skin lesions - Benign-appearing - Call for any changes  BENIGN MELANOCYTIC NEVI - Tan-brown and/or pink-flesh-colored symmetric macules and papules - Benign appearing on exam today - Observation - Call clinic for new or changing moles - Recommend daily use of broad spectrum spf 30+ sunscreen to sun-exposed areas.   MILD ACTINIC DAMAGE - Chronic condition, secondary to cumulative UV/sun exposure - diffuse scaly erythematous macules with  underlying dyspigmentation - Recommend daily broad spectrum sunscreen SPF 30+ to sun-exposed areas, reapply every 2 hours as needed.  - Staying in the shade or wearing long sleeves, sun glasses (UVA+UVB protection) and wide brim hats (4-inch brim around the entire circumference of the hat) are also recommended for sun protection.  - Call for new or changing lesions.   SKIN CANCER SCREENING PERFORMED TODAY.  NEOPLASM OF UNCERTAIN BEHAVIOR OF SKIN (3) Left Medial canthus Skin / nail biopsy Type of biopsy: tangential   Informed consent: discussed and consent obtained   Timeout: patient name, date of birth, surgical site, and procedure verified   Procedure prep:  Patient was prepped and draped in usual sterile fashion Prep type:  Isopropyl alcohol Anesthesia: the lesion was anesthetized in a standard fashion   Anesthetic:  1% lidocaine  w/ epinephrine 1-100,000 buffered w/ 8.4% NaHCO3 Instrument used: DermaBlade   Hemostasis achieved with: aluminum chloride   Outcome: patient tolerated procedure well   Post-procedure details: sterile dressing applied and wound care instructions given   Dressing type: petrolatum gauze and bandage    Left Nasal Sidewall Skin / nail biopsy Type of biopsy: tangential   Informed consent: discussed and consent obtained   Timeout: patient name, date of birth, surgical site, and procedure verified   Procedure prep:  Patient was prepped and draped in usual sterile fashion Prep type:  Isopropyl alcohol Anesthesia: the lesion was anesthetized in a standard fashion   Anesthetic:  1% lidocaine  w/ epinephrine 1-100,000 buffered w/ 8.4% NaHCO3 Instrument used: DermaBlade   Hemostasis achieved with: aluminum chloride   Outcome: patient tolerated procedure well   Post-procedure details: sterile dressing  applied and wound care instructions given   Dressing type: petrolatum gauze and bandage    Left Lower Leg - Posterior Return in about 1 year (around 09/29/2024) for  TBSE.   Documentation: I have reviewed the above documentation for accuracy and completeness, and I agree with the above.  I, Mckenley Birenbaum Maranda, CMA, am acting as scribe for [employer], DO.   Delon Lenis, DO

## 2023-09-30 NOTE — Patient Instructions (Addendum)

## 2023-10-01 DIAGNOSIS — G4733 Obstructive sleep apnea (adult) (pediatric): Secondary | ICD-10-CM | POA: Diagnosis not present

## 2023-10-04 ENCOUNTER — Encounter (HOSPITAL_BASED_OUTPATIENT_CLINIC_OR_DEPARTMENT_OTHER): Payer: Self-pay | Admitting: Family Medicine

## 2023-10-04 LAB — SURGICAL PATHOLOGY

## 2023-10-11 ENCOUNTER — Other Ambulatory Visit (HOSPITAL_BASED_OUTPATIENT_CLINIC_OR_DEPARTMENT_OTHER): Payer: Self-pay | Admitting: Family Medicine

## 2023-10-11 DIAGNOSIS — F419 Anxiety disorder, unspecified: Secondary | ICD-10-CM

## 2023-10-12 ENCOUNTER — Other Ambulatory Visit (HOSPITAL_BASED_OUTPATIENT_CLINIC_OR_DEPARTMENT_OTHER): Payer: Self-pay | Admitting: Family Medicine

## 2023-10-12 ENCOUNTER — Other Ambulatory Visit: Payer: Self-pay

## 2023-10-12 ENCOUNTER — Ambulatory Visit: Payer: Self-pay | Admitting: Dermatology

## 2023-10-12 DIAGNOSIS — F419 Anxiety disorder, unspecified: Secondary | ICD-10-CM

## 2023-10-12 MED FILL — Escitalopram Oxalate Tab 10 MG (Base Equiv): ORAL | 90 days supply | Qty: 90 | Fill #0 | Status: AC

## 2023-10-14 ENCOUNTER — Ambulatory Visit (HOSPITAL_BASED_OUTPATIENT_CLINIC_OR_DEPARTMENT_OTHER): Admitting: Family Medicine

## 2023-10-14 ENCOUNTER — Encounter (HOSPITAL_BASED_OUTPATIENT_CLINIC_OR_DEPARTMENT_OTHER): Payer: Self-pay | Admitting: Family Medicine

## 2023-10-14 VITALS — BP 136/90 | HR 72 | Temp 98.1°F | Resp 16 | Wt 280.0 lb

## 2023-10-14 DIAGNOSIS — G4733 Obstructive sleep apnea (adult) (pediatric): Secondary | ICD-10-CM | POA: Diagnosis not present

## 2023-10-14 DIAGNOSIS — I480 Paroxysmal atrial fibrillation: Secondary | ICD-10-CM | POA: Diagnosis not present

## 2023-10-14 DIAGNOSIS — E782 Mixed hyperlipidemia: Secondary | ICD-10-CM

## 2023-10-14 NOTE — Assessment & Plan Note (Signed)
 F/u with Cardiology as directed.

## 2023-10-14 NOTE — Assessment & Plan Note (Signed)
 Continue statin therapy

## 2023-10-14 NOTE — Assessment & Plan Note (Signed)
 Work harder on diet and exercise.  May eventually consider a GLP-1.

## 2023-10-14 NOTE — Progress Notes (Signed)
 Established Patient Office Visit  Subjective   Patient ID: Alexander James, male    DOB: December 23, 1980  Age: 43 y.o. MRN: [id number]  Chief Complaint  Patient presents with   Blood pressure     Hypertension    F/u as above.  Generally doing well.  Ongoing struggles with weight loss.  His OSA is now appropriately treated with CPAP and I'm delighted that he feels much better.  Recent dermatology visit noted.    Past Medical History:  Diagnosis Date   Essential hypertension 06/09/2019   Family hx of colon cancer    father   GERD (gastroesophageal reflux disease)    Morbid obesity (HCC)    Comorbidities:  HTN, PAF   OSA (obstructive sleep apnea)    Paroxysmal A-fib (HCC) 06/09/2019   f/by [facility]   Skin lesion    f/by dermatology    Outpatient Encounter Medications as of 10/14/2023  Medication Sig   diltiazem  (CARTIA  XT) 120 MG 24 hr capsule Take 1 capsule (120 mg total) by mouth daily.   escitalopram  (LEXAPRO ) 10 MG tablet Take 1 tablet (10 mg total) by mouth daily.   esomeprazole  (NEXIUM ) 40 MG capsule Take 1 capsule (40 mg total) by mouth every morning.   lisinopril  (ZESTRIL ) 10 MG tablet Take 1 tablet (10 mg total) by mouth daily.   Multiple Vitamins-Minerals (MULTIVITAMIN GUMMIES ADULT PO) Take 2 tablets by mouth daily.   pravastatin  (PRAVACHOL ) 20 MG tablet Take 1 tablet (20 mg total) by mouth at bedtime.   No facility-administered encounter medications on file as of 10/14/2023.    Social History   Tobacco Use   Smoking status: Never    Passive exposure: Never   Smokeless tobacco: Never  Vaping Use   Vaping status: Never Used  Substance Use Topics   Alcohol use: Not Currently    Comment: usually drinks 3-4 beers a day   Drug use: No      Review of Systems  Constitutional:  Negative for diaphoresis, fever, malaise/fatigue and weight loss.  Respiratory:  Negative for cough, shortness of breath and wheezing.   Cardiovascular:  Negative for chest pain,  palpitations, orthopnea, claudication, leg swelling and PND.      Objective:     BP (!) 136/90 (BP Location: Left Arm, Patient Position: Sitting, Cuff Size: Normal)   Pulse 72   Temp 98.1 F (36.7 C) (Oral)   Resp 16   Wt 280 lb (127 kg)   SpO2 94%   BMI 36.94 kg/m    Physical Exam Constitutional:      General: He is not in acute distress.    Appearance: Normal appearance. He is obese.  HENT:     Head: Normocephalic.  Neck:     Vascular: No carotid bruit.  Cardiovascular:     Rate and Rhythm: Normal rate and regular rhythm.     Pulses: Normal pulses.     Heart sounds: Normal heart sounds.  Pulmonary:     Effort: Pulmonary effort is normal.     Breath sounds: Normal breath sounds.  Abdominal:     General: Bowel sounds are normal.     Palpations: Abdomen is soft.  Musculoskeletal:     Cervical back: Neck supple. No tenderness.     Right lower leg: No edema.     Left lower leg: No edema.  Neurological:     Mental Status: He is alert.      No results found for any visits on  10/14/23.    The 10-year ASCVD risk score (Arnett DK, et al., 2019) is: 1.3%    Assessment & Plan:  Paroxysmal atrial fibrillation [facility]) Assessment & Plan: F/u with Cardiology as directed.   Mixed hyperlipidemia Assessment & Plan: Continue statin therapy.   OSA on CPAP Assessment & Plan: Nicely improved.   Morbid obesity (HCC) Assessment & Plan: Work harder on diet and exercise.  May eventually consider a GLP-1.   I personally spent a total of 25 minutes in the care of the patient today including performing a medically appropriate exam/evaluation, counseling and educating, documenting clinical information in the EHR, and communicating results.   Return in about 6 months (around 04/12/2024) for chronic follow-up.    REDDING PONCE NORLEEN FALCON., MD

## 2023-10-14 NOTE — Assessment & Plan Note (Signed)
 Nicely improved.

## 2023-10-18 ENCOUNTER — Other Ambulatory Visit: Payer: Self-pay

## 2023-10-18 ENCOUNTER — Other Ambulatory Visit (HOSPITAL_BASED_OUTPATIENT_CLINIC_OR_DEPARTMENT_OTHER): Payer: Self-pay | Admitting: Family Medicine

## 2023-10-18 DIAGNOSIS — I10 Essential (primary) hypertension: Secondary | ICD-10-CM

## 2023-10-18 MED ORDER — LISINOPRIL 10 MG PO TABS
10.0000 mg | ORAL_TABLET | Freq: Every day | ORAL | 1 refills | Status: DC
Start: 2023-10-18 — End: 2023-10-19

## 2023-10-19 ENCOUNTER — Other Ambulatory Visit (HOSPITAL_BASED_OUTPATIENT_CLINIC_OR_DEPARTMENT_OTHER): Payer: Self-pay | Admitting: Family Medicine

## 2023-10-19 ENCOUNTER — Other Ambulatory Visit: Payer: Self-pay

## 2023-10-19 ENCOUNTER — Telehealth (HOSPITAL_BASED_OUTPATIENT_CLINIC_OR_DEPARTMENT_OTHER): Payer: Self-pay | Admitting: Family Medicine

## 2023-10-19 DIAGNOSIS — I10 Essential (primary) hypertension: Secondary | ICD-10-CM

## 2023-10-19 MED ORDER — LISINOPRIL 10 MG PO TABS
10.0000 mg | ORAL_TABLET | Freq: Every day | ORAL | 1 refills | Status: DC
  Filled 2023-10-19: qty 90, 90d supply, fill #0

## 2023-10-19 NOTE — Telephone Encounter (Signed)
 Copied from CRM [id number]. Topic: Clinical - Prescription Issue >> Oct 19, 2023 12:51 PM Franky GRADE wrote: Reason for CRM: Patient's spouse is calling because his lisinopril  (ZESTRIL ) 10 MG tablet [[id number]] was sent to the incorrect pharamcy it should be going to [facility]  Phone: [phone] Fax: [phone].

## 2023-10-25 ENCOUNTER — Other Ambulatory Visit (HOSPITAL_COMMUNITY): Payer: Self-pay | Admitting: Physician Assistant

## 2023-10-26 ENCOUNTER — Other Ambulatory Visit: Payer: Self-pay

## 2023-10-26 MED ORDER — DILTIAZEM HCL ER COATED BEADS 120 MG PO CP24
120.0000 mg | ORAL_CAPSULE | Freq: Every day | ORAL | 2 refills | Status: DC
  Filled 2023-10-26: qty 90, 90d supply, fill #0

## 2023-10-26 MED ORDER — LISINOPRIL 10 MG PO TABS
10.0000 mg | ORAL_TABLET | Freq: Every day | ORAL | 1 refills | Status: DC
  Filled 2023-10-26: qty 90, 90d supply, fill #0

## 2023-10-27 DIAGNOSIS — F4320 Adjustment disorder, unspecified: Secondary | ICD-10-CM | POA: Diagnosis not present

## 2023-11-01 DIAGNOSIS — G4733 Obstructive sleep apnea (adult) (pediatric): Secondary | ICD-10-CM | POA: Diagnosis not present

## 2023-11-11 ENCOUNTER — Other Ambulatory Visit: Payer: Self-pay

## 2023-11-11 ENCOUNTER — Ambulatory Visit: Admitting: Adult Health

## 2023-11-11 ENCOUNTER — Encounter: Payer: Self-pay | Admitting: Adult Health

## 2023-11-11 VITALS — BP 128/72 | HR 71 | Temp 98.1°F | Ht 73.0 in | Wt 285.6 lb

## 2023-11-11 DIAGNOSIS — G4733 Obstructive sleep apnea (adult) (pediatric): Secondary | ICD-10-CM | POA: Diagnosis not present

## 2023-11-11 DIAGNOSIS — Z6837 Body mass index (BMI) 37.0-37.9, adult: Secondary | ICD-10-CM | POA: Diagnosis not present

## 2023-11-11 MED ORDER — ZEPBOUND 2.5 MG/0.5ML ~~LOC~~ SOAJ
2.5000 mg | SUBCUTANEOUS | 0 refills | Status: DC
  Filled 2023-11-11 – 2023-11-15 (×4): qty 2, 28d supply, fill #0

## 2023-11-11 NOTE — Patient Instructions (Addendum)
 Continue on CPAP At bedtime, wear all night long  Do not drive if sleepy  Work on healthy weight loss.  Begin Zepbound 2.5mg  injection weekly Healthy diet and exercise  High protein diet  If you develop stomach upset with nausea, vomiting or diarrhea call for sooner follow up  Follow up in 4 weeks and As needed

## 2023-11-11 NOTE — Progress Notes (Signed)
 @Patient  ID: Alexander James, male    DOB: 1980/04/01, 43 y.o.   MRN: [id number]  Chief Complaint  Patient presents with   Obstructive Sleep Apnea    Referring provider: Dottie Norleen PHEBE PONCE, MD  HPI: 43 yo male seen for sleep consult July 2025 to establish for sleep apnea   TEST/EVENTS :  Sleep study May 2025 showed moderate sleep apnea with AHI at 16/hour and SpO2 low at 81%  Discussed the use of AI scribe software for clinical note transcription with the patient, who gave verbal consent to proceed.  History of Present Illness Alexander James is a 43 year old male with moderate sleep apnea who presents for follow-up on CPAP therapy.  He was diagnosed with moderate sleep apnea on home sleep study May 2025  and started on CPAP therapy  after his last visit. He uses a full face mask and notes minor leaks due to facial hair, but these do not disturb his sleep.  Feels that CPAP has really made a difference in his sleep he feels more rested and less sleepy during the daytime.  Does feel that he has more energy.  CPAP download shows 100% compliance daily average usage 7 hours patient is on auto CPAP 5 to 15 cm H2O.  Daily average pressure at 14 cm H2O.  AHI 1.4/hour.  He has a history of weight management challenges and has attempted to cut back on alcohol and food intake. Despite these efforts, he finds it difficult to lose weight, attributing this to a busy lifestyle managing a business and family responsibilities.  He has tried Ozempic  in the past and had success but regained weight after stopping medication  Current weight is 285 pounds with a BMI at 37  His past medical history includes an ablation for atrial fibrillation, high blood pressure, and high cholesterol. No history of thyroid  cancer, major depression, pancreatitis, or gallbladder disease.   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     No Known Allergies  Immunization History  Administered  Date(s) Administered   Influenza, Quadrivalent, Recombinant, Inj, Pf 11/04/2022   Tdap 05/23/2021    Past Medical History:  Diagnosis Date   Essential hypertension 06/09/2019   Family hx of colon cancer    father   GERD (gastroesophageal reflux disease)    Morbid obesity (HCC)    Comorbidities:  HTN, PAF   OSA (obstructive sleep apnea)    Paroxysmal A-fib (HCC) 06/09/2019   f/by [facility]   Skin lesion    f/by dermatology    Tobacco History: Social History   Tobacco Use  Smoking Status Never   Passive exposure: Never  Smokeless Tobacco Never   Counseling given: Not Answered   Outpatient Medications Prior to Visit  Medication Sig Dispense Refill   diltiazem  (CARTIA  XT) 120 MG 24 hr capsule Take 1 capsule (120 mg total) by mouth daily. 90 capsule 2   escitalopram  (LEXAPRO ) 10 MG tablet Take 1 tablet (10 mg total) by mouth daily. 90 tablet 1   esomeprazole  (NEXIUM ) 40 MG capsule Take 1 capsule (40 mg total) by mouth every morning. 90 capsule 3   lisinopril  (ZESTRIL ) 10 MG tablet Take 1 tablet (10 mg total) by mouth daily. 90 tablet 1   Multiple Vitamins-Minerals (MULTIVITAMIN GUMMIES ADULT PO) Take 2 tablets by mouth daily.     pravastatin  (PRAVACHOL ) 20 MG tablet Take 1 tablet (20 mg total) by mouth at bedtime. 90 tablet 0  lisinopril  (ZESTRIL ) 10 MG tablet Take 1 tablet (10 mg total) by mouth daily. 90 tablet 1   No facility-administered medications prior to visit.     Review of Systems:   Constitutional:   No  weight loss, night sweats,  Fevers, chills, fatigue, or  lassitude.  HEENT:   No headaches,  Difficulty swallowing,  Tooth/dental problems, or  Sore throat,                No sneezing, itching, ear ache, nasal congestion, post nasal drip,   CV:  No chest pain,  Orthopnea, PND, swelling in lower extremities, anasarca, dizziness, palpitations, syncope.   GI  No heartburn, indigestion, abdominal pain, nausea, vomiting, diarrhea, change in bowel habits, loss  of appetite, bloody stools.   Resp: No shortness of breath with exertion or at rest.  No excess mucus, no productive cough,  No non-productive cough,  No coughing up of blood.  No change in color of mucus.  No wheezing.  No chest wall deformity  Skin: no rash or lesions.  GU: no dysuria, change in color of urine, no urgency or frequency.  No flank pain, no hematuria   MS:  No joint pain or swelling.  No decreased range of motion.  No back pain.    Physical Exam  BP 128/72   Pulse 71   Temp 98.1 F (36.7 C)   Ht 6' 1 (1.854 m)   Wt 285 lb 9.6 oz (129.5 kg)   SpO2 97% Comment: RA  BMI 37.68 kg/m   GEN: A/Ox3; pleasant , NAD, well nourished    HEENT:  [place]/AT,  , NOSE-clear, THROAT-clear, no lesions, no postnasal drip or exudate noted.   NECK:  Supple w/ fair ROM; no JVD; normal carotid impulses w/o bruits; no thyromegaly or nodules palpated; no lymphadenopathy.    RESP  Clear  P & A; w/o, wheezes/ rales/ or rhonchi. no accessory muscle use, no dullness to percussion  CARD:  RRR, no m/r/g, no peripheral edema, pulses intact, no cyanosis or clubbing.  GI:   Soft & nt; nml bowel sounds; no organomegaly or masses detected.   Musco: Warm bil, no deformities or joint swelling noted.   Neuro: alert, no focal deficits noted.    Skin: Warm, no lesions or rashes    Lab Results:  CBC    Component Value Date/Time   WBC 12.6 (H) 05/07/2023 0000   WBC 6.4 05/23/2021 0840   RBC 5.03 05/07/2023 0000   RBC 5.01 05/23/2021 0840   HGB 16.0 05/07/2023 0000   HCT 47.5 05/07/2023 0000   PLT 300 05/07/2023 0000   MCV 94 05/07/2023 0000   MCH 31.8 05/07/2023 0000   MCH 30.9 05/23/2021 0840   MCHC 33.7 05/07/2023 0000   MCHC 35.1 05/23/2021 0840   RDW 12.5 05/07/2023 0000   LYMPHSABS 1.2 05/07/2023 0000   EOSABS 0.1 05/07/2023 0000   BASOSABS 0.0 05/07/2023 0000    BMET    Component Value Date/Time   NA 141 05/07/2023 0000   K 4.7 05/07/2023 0000   CL 104 05/07/2023 0000    CO2 21 05/07/2023 0000   GLUCOSE 101 (H) 05/07/2023 0000   GLUCOSE 96 05/23/2021 0840   BUN 13 05/07/2023 0000   CREATININE 0.94 05/07/2023 0000   CALCIUM 9.7 05/07/2023 0000   GFRNONAA >60 05/23/2021 0840   GFRAA 128 10/12/2019 0814    BNP No results found for: BNP  ProBNP No results found for: PROBNP  Imaging:  No results found.  Administration History     None           No data to display          No results found for: NITRICOXIDE      Assessment & Plan:   Assessment and Plan Assessment & Plan Obstructive sleep apnea   Moderate obstructive sleep apnea is well-controlled with CPAP therapy, effectively reducing apnea episodes .  Has excellent control compliance.  Has perceived benefit.  Minor mask leaks due to facial hair do not affect sleep quality or apnea control. Continue CPAP therapy with current settings. Advise regular mask replacement due to wear. Schedule follow-up in six months to assess CPAP efficacy.  Obesity  -BMI 37 He struggles with weight loss despite lifestyle modifications. Discussed the potential use of Zepbound (tirzepatide) for weight management, with no contraindications such as thyroid  cancer, pancreatitis, or gallbladder disease. Emphasized the importance of dietary changes and exercise alongside medication. Discussed potential James effects, including gastrointestinal symptoms and rare depression-like symptoms. Insurance coverage for Zepbound is uncertain, but alternative payment options are available. Explained that Zepbound, a GLP-1 and GIP receptor agonist, helps decrease appetite and food cravings, requiring lifestyle changes for effectiveness.  Recent labs were reviewed.  Prescribe Zepbound 2.5 mg weekly injection. Advise on dietary modifications: high protein, fresh foods, smaller portions. Encourage regular exercise. Monitor for James effects, especially gastrointestinal and mood changes. Repeat lab work in three months to monitor  kidney and liver function. Schedule follow-up in four weeks to assess response to Zepbound. Coordinate with insurance for Zepbound coverage and provide information on its administration. Patient has moderate  sleep apnea related to obesity with BMI >/30, posing significant cardiovascular risks. Patient has failed traditional weight loss measures with diet and exercise for >/6 months. Patient will be initiated on Zepbound (tirzepatide) for weight management. Zepbound is the only pharmaceutical treatment approved for moderate-to-severe OSA in adults who are overweight (BMI >/27) or obese (BMI >/30). The patient will continue lifestyle modifications, including structured nutrition and physical activity as directed. No other GLP1 therapy will be used simultaneously at this time. The patient does not have any FDA labeled contraindications to this agent, including pregnancy, lactation, hx or family history of medullary thyroid  cancer, or multiple endocrine neoplasia type II. James effect profile has been reviewed with patient. Aware of red flag symptoms to notify of immediately or seek emergency care, including severe nausea/vomiting, inability to pass bowels or gas, severe abdominal pain/tenderness, jaundice.    History of atrial fibrillation, hypertension continue follow-up with cardiology      Madelin Stank, NP 11/11/2023

## 2023-11-15 ENCOUNTER — Other Ambulatory Visit: Payer: Self-pay

## 2023-11-16 ENCOUNTER — Other Ambulatory Visit: Payer: Self-pay

## 2023-11-17 DIAGNOSIS — F4320 Adjustment disorder, unspecified: Secondary | ICD-10-CM | POA: Diagnosis not present

## 2023-11-19 ENCOUNTER — Ambulatory Visit

## 2023-11-22 ENCOUNTER — Ambulatory Visit: Attending: Cardiology | Admitting: Cardiology

## 2023-11-22 ENCOUNTER — Encounter: Payer: Self-pay | Admitting: Cardiology

## 2023-11-22 ENCOUNTER — Other Ambulatory Visit: Payer: Self-pay

## 2023-11-22 VITALS — BP 150/96 | HR 77 | Ht 73.0 in | Wt 285.0 lb

## 2023-11-22 DIAGNOSIS — I480 Paroxysmal atrial fibrillation: Secondary | ICD-10-CM

## 2023-11-22 DIAGNOSIS — E782 Mixed hyperlipidemia: Secondary | ICD-10-CM

## 2023-11-22 DIAGNOSIS — I10 Essential (primary) hypertension: Secondary | ICD-10-CM | POA: Diagnosis not present

## 2023-11-22 MED ORDER — DILTIAZEM HCL ER COATED BEADS 120 MG PO CP24
120.0000 mg | ORAL_CAPSULE | Freq: Every day | ORAL | 2 refills | Status: AC
  Filled 2023-11-22 – 2024-01-24 (×3): qty 90, 90d supply, fill #0

## 2023-11-22 MED ORDER — LISINOPRIL 10 MG PO TABS
10.0000 mg | ORAL_TABLET | Freq: Every day | ORAL | 1 refills | Status: AC
  Filled 2023-11-22 – 2024-01-03 (×2): qty 90, 90d supply, fill #0

## 2023-11-22 MED ORDER — PRAVASTATIN SODIUM 20 MG PO TABS
20.0000 mg | ORAL_TABLET | Freq: Every day | ORAL | 3 refills | Status: AC
  Filled 2023-11-22 – 2024-01-03 (×2): qty 90, 90d supply, fill #0

## 2023-11-22 NOTE — Progress Notes (Signed)
 Cardiology Consultation:    Date:  11/22/2023   ID:  Alexander James, DOB 1980/08/29, MRN [id number]  PCP:  Dottie Norleen PHEBE PONCE, MD  Cardiologist:  Lamar Fitch, MD   Referring MD: Dottie Norleen PHEBE PONCE, MD   Chief Complaint  Patient presents with   Establish Care    History of Present Illness:    Alexander James is a 43 y.o. male who is being seen today for the evaluation of history of atrial fibrillation ablation at the request of [place], John F. II, MD. past medical history significant for atrial fibrillation ablation done in September 2021, obesity, essential hypertension, dyslipidemia.  Comes today to my office because he would like to be reestablished as a patient.  Overall he works physically outside and he had no difficulty doing it.  Denies have any palpitation no chest pain tightness squeezing pressure burning chest no shortness of breath no swelling of lower extremities.  He is disappointed with himself because he gained some weight.  Does not smoke is not on any diet but he is planning to lose about 20 pounds within the next few months.  Does have family history of coronary artery disease but not premature.  There is some issue about potentially having sleep apnea  Past Medical History:  Diagnosis Date   Arrhythmia    Afib   Essential hypertension 06/09/2019   Family hx of colon cancer    father   GERD (gastroesophageal reflux disease)    Hyperlipidemia    On meds   Morbid obesity (HCC)    Comorbidities:  HTN, PAF   OSA (obstructive sleep apnea)    Paroxysmal A-fib (HCC) 06/09/2019   f/by [facility]   Skin lesion    f/by dermatology    Past Surgical History:  Procedure Laterality Date   ATRIAL FIBRILLATION ABLATION N/A 11/09/2019   Procedure: ATRIAL FIBRILLATION ABLATION;  Surgeon: Kelsie Agent, MD;  Location: [facility];  Service: Cardiovascular;  Laterality: N/A;   COLONOSCOPY N/A 05/23/2021   Procedure: COLONOSCOPY;  Surgeon: Jinny Carmine, MD;   [facility];  Service: Endoscopy;  Laterality: N/A;   TONSILLECTOMY      Current Medications: Current Meds  Medication Sig   diltiazem  (CARTIA  XT) 120 MG 24 hr capsule Take 1 capsule (120 mg total) by mouth daily.   escitalopram  (LEXAPRO ) 10 MG tablet Take 1 tablet (10 mg total) by mouth daily.   esomeprazole  (NEXIUM ) 40 MG capsule Take 1 capsule (40 mg total) by mouth every morning.   lisinopril  (ZESTRIL ) 10 MG tablet Take 1 tablet (10 mg total) by mouth daily.   Multiple Vitamins-Minerals (MULTIVITAMIN GUMMIES ADULT PO) Take 2 tablets by mouth daily.   pravastatin  (PRAVACHOL ) 20 MG tablet Take 1 tablet (20 mg total) by mouth at bedtime.     Allergies:   Patient has no known allergies.   Social History   Socioeconomic History   Marital status: Married    Spouse name: Not on file   Number of children: 2   Years of education: Not on file   Highest education level: Bachelor's degree (e.g., BA, AB, BS)  Occupational History   Occupation: Art gallery manager: [employer]  Tobacco Use   Smoking status: Never    Passive exposure: Never   Smokeless tobacco: Never  Vaping Use   Vaping status: Never Used  Substance and Sexual Activity   Alcohol use: Not Currently    Comment: usually drinks  3-4 beers a day   Drug use: No   Sexual activity: Not on file  Other Topics Concern   Not on file  Social History Narrative   Lives in [place] with spouse and 2 daughters   Owns a hauling and grading business   Social Drivers of Health   Financial Resource Strain: Patient Declined (10/11/2023)   Overall Financial Resource Strain (CARDIA)    Difficulty of Paying Living Expenses: Patient declined  Food Insecurity: Patient Declined (10/11/2023)   Hunger Vital Sign    Worried About Running Out of Food in the Last Year: Patient declined    Ran Out of Food in the Last Year: Patient declined  Transportation Needs: Patient Declined (10/11/2023)   PRAPARE -  Administrator, Civil Service (Medical): Patient declined    Lack of Transportation (Non-Medical): Patient declined  Physical Activity: Insufficiently Active (10/11/2023)   Exercise Vital Sign    Days of Exercise per Week: 3 days    Minutes of Exercise per Session: 30 min  Stress: Stress Concern Present (10/11/2023)   [employer] of Occupational Health - Occupational Stress Questionnaire    Feeling of Stress: Rather much  Social Connections: Moderately Isolated (10/11/2023)   Social Connection and Isolation Panel    Frequency of Communication with Friends and Family: More than three times a week    Frequency of Social Gatherings with Friends and Family: More than three times a week    Attends Religious Services: Patient declined    Database administrator or Organizations: No    Attends Engineer, structural: Not on file    Marital Status: Married     Family History: The patient's family history includes Colon cancer (age of onset: 77) in his father; Hypertension in his mother. ROS:   Please see the history of present illness.    All 14 point review of systems negative except as described per history of present illness.  EKGs/Labs/Other Studies Reviewed:    The following studies were reviewed today:   EKG:  EKG Interpretation Date/Time:  Monday November 22 2023 08:56:39 EDT Ventricular Rate:  77 PR Interval:  158 QRS Duration:  98 QT Interval:  378 QTC Calculation: 427 R Axis:   40  Text Interpretation: Normal sinus rhythm Normal ECG When compared with ECG of 20-Nov-2022 08:41, Criteria for Septal infarct are no longer Present Confirmed by Bernie Charleston [phone]) on 11/22/2023 9:06:19 AM    Recent Labs: 05/07/2023: ALT 22; BUN 13; Creatinine, Ser 0.94; Hemoglobin 16.0; Platelets 300; Potassium 4.7; Sodium 141  Recent Lipid Panel    Component Value Date/Time   CHOL 192 05/07/2023 0000   TRIG 127 05/07/2023 0000   HDL 63 05/07/2023 0000   CHOLHDL 3.0  05/07/2023 0000   CHOLHDL 5.0 05/23/2021 0840   VLDL 16 05/23/2021 0840   LDLCALC 107 (H) 05/07/2023 0000    Physical Exam:    VS:  BP (!) 150/96   Pulse 77   Ht 6' 1 (1.854 m)   Wt 285 lb (129.3 kg)   SpO2 97%   BMI 37.60 kg/m     Wt Readings from Last 3 Encounters:  11/22/23 285 lb (129.3 kg)  11/11/23 285 lb 9.6 oz (129.5 kg)  10/14/23 280 lb (127 kg)     GEN:  Well nourished, well developed in no acute distress HEENT: Normal NECK: No JVD; No carotid bruits LYMPHATICS: No lymphadenopathy CARDIAC: RRR, no murmurs, no rubs, no gallops RESPIRATORY:  Clear to auscultation without rales, wheezing or rhonchi  ABDOMEN: Soft, non-tender, non-distended MUSCULOSKELETAL:  No edema; No deformity  SKIN: Warm and dry NEUROLOGIC:  Alert and oriented x 3 PSYCHIATRIC:  Normal affect   ASSESSMENT:    1. Paroxysmal atrial fibrillation (HCC)   2. Mixed hyperlipidemia   3. Essential hypertension    PLAN:    In order of problems listed above:  Paroxysmal atrial fibrillation, denies have any palpitations, he is not anticoagulated, his CHADS2 Vascor equals only 1, no recurrences of arrhythmia. Mixed dyslipidemia he is taking pravastatin  20 I did review [facility] data from March of this year showing LDL 107 HDL 63.  Will schedule him to have calcium score to the family we need to augment this therapy. Essential hypertension he tells me that he check his blood pressure at home it is always good.  Asking to simply check his blood pressure for next couple   Medication Adjustments/Labs and Tests Ordered: Current medicines are reviewed at length with the patient today.  Concerns regarding medicines are outlined above.  Orders Placed This Encounter  Procedures   EKG 12-Lead   No orders of the defined types were placed in this encounter.   Signed, Lamar DOROTHA Fitch, MD, [facility] 11/22/2023 9:14 AM    [facility]

## 2023-11-22 NOTE — Patient Instructions (Addendum)
 Medication Instructions:  Your physician recommends that you continue on your current medications as directed. Please refer to the Current Medication list given to you today.  *If you need a refill on your cardiac medications before your next appointment, please call your pharmacy*   Lab Work: None Ordered If you have labs (blood work) drawn today and your tests are completely normal, you will receive your results only by: MyChart Message (if you have MyChart) OR A paper copy in the mail If you have any lab test that is abnormal or we need to change your treatment, we will call you to review the results.   Testing/Procedures: We will order CT coronary calcium score. It will cost $99.00 and is not covered by insurance.  Please call to schedule.    [facility] [address] [place], [place] [address] [phone]    Follow-Up: At [facility], you and your health needs are our priority.  As part of our continuing mission to provide you with exceptional heart care, we have created designated Provider Care Teams.  These Care Teams include your primary Cardiologist (physician) and Advanced Practice Providers (APPs -  Physician Assistants and Nurse Practitioners) who all work together to provide you with the care you need, when you need it.  We recommend signing up for the patient portal called MyChart.  Sign up information is provided on this After Visit Summary.  MyChart is used to connect with patients for Virtual Visits (Telemedicine).  Patients are able to view lab/test results, encounter notes, upcoming appointments, etc.  Non-urgent messages can be sent to your provider as well.   To learn more about what you can do with MyChart, go to [email or web address].    Your next appointment:   12 month(s)  The format for your next appointment:   In Person  Provider:   Lamar Fitch, MD    Other Instructions NA

## 2023-11-22 NOTE — Addendum Note (Signed)
 Addended by: ARLOA PLANAS D on: 11/22/2023 09:32 AM   Modules accepted: Orders

## 2023-11-23 ENCOUNTER — Ambulatory Visit (INDEPENDENT_AMBULATORY_CARE_PROVIDER_SITE_OTHER)
Admission: RE | Admit: 2023-11-23 | Discharge: 2023-11-23 | Disposition: A | Discharge status: Home / Self Care | Payer: Self-pay | Source: Ambulatory Visit | Attending: Cardiology | Admitting: Cardiology

## 2023-11-23 DIAGNOSIS — E782 Mixed hyperlipidemia: Secondary | ICD-10-CM

## 2023-11-23 DIAGNOSIS — Z136 Encounter for screening for cardiovascular disorders: Secondary | ICD-10-CM | POA: Diagnosis not present

## 2023-12-01 ENCOUNTER — Ambulatory Visit: Payer: Self-pay | Admitting: Cardiology

## 2023-12-15 DIAGNOSIS — F4320 Adjustment disorder, unspecified: Secondary | ICD-10-CM | POA: Diagnosis not present

## 2023-12-20 ENCOUNTER — Telehealth: Payer: Self-pay

## 2023-12-20 NOTE — Telephone Encounter (Signed)
 Pt viewed Ca Score results on My Chart per Dr. Vanetta Shawl note. Routed to PCP.

## 2023-12-23 ENCOUNTER — Ambulatory Visit: Admitting: Adult Health

## 2023-12-29 DIAGNOSIS — M25461 Effusion, right knee: Secondary | ICD-10-CM | POA: Diagnosis not present

## 2023-12-29 DIAGNOSIS — M25561 Pain in right knee: Secondary | ICD-10-CM | POA: Diagnosis not present

## 2023-12-30 ENCOUNTER — Other Ambulatory Visit: Payer: Self-pay | Admitting: Orthopedic Surgery

## 2023-12-30 DIAGNOSIS — M25561 Pain in right knee: Secondary | ICD-10-CM

## 2024-01-03 ENCOUNTER — Other Ambulatory Visit: Payer: Self-pay

## 2024-01-03 MED FILL — Escitalopram Oxalate Tab 10 MG (Base Equiv): ORAL | 90 days supply | Qty: 90 | Fill #1 | Status: AC

## 2024-01-04 ENCOUNTER — Ambulatory Visit
Admission: RE | Admit: 2024-01-04 | Discharge: 2024-01-04 | Disposition: A | Discharge status: Home / Self Care | Source: Ambulatory Visit | Attending: Orthopedic Surgery | Admitting: Orthopedic Surgery

## 2024-01-04 DIAGNOSIS — M25361 Other instability, right knee: Secondary | ICD-10-CM | POA: Diagnosis not present

## 2024-01-04 DIAGNOSIS — M7121 Synovial cyst of popliteal space [Baker], right knee: Secondary | ICD-10-CM | POA: Diagnosis not present

## 2024-01-04 DIAGNOSIS — M25561 Pain in right knee: Secondary | ICD-10-CM | POA: Diagnosis not present

## 2024-01-04 DIAGNOSIS — M25461 Effusion, right knee: Secondary | ICD-10-CM | POA: Diagnosis not present

## 2024-01-05 ENCOUNTER — Other Ambulatory Visit: Payer: Self-pay | Admitting: *Deleted

## 2024-01-05 MED ORDER — ZEPBOUND 2.5 MG/0.5ML ~~LOC~~ SOAJ
2.5000 mg | SUBCUTANEOUS | 0 refills | Status: AC
  Filled 2024-01-12 – 2024-01-24 (×3): qty 2, 28d supply, fill #0

## 2024-01-12 ENCOUNTER — Other Ambulatory Visit (HOSPITAL_COMMUNITY): Payer: Self-pay

## 2024-01-12 ENCOUNTER — Telehealth (HOSPITAL_COMMUNITY): Payer: Self-pay

## 2024-01-12 ENCOUNTER — Other Ambulatory Visit (HOSPITAL_BASED_OUTPATIENT_CLINIC_OR_DEPARTMENT_OTHER): Payer: Self-pay

## 2024-01-13 ENCOUNTER — Other Ambulatory Visit (HOSPITAL_COMMUNITY): Payer: Self-pay

## 2024-01-14 ENCOUNTER — Other Ambulatory Visit (HOSPITAL_COMMUNITY): Payer: Self-pay

## 2024-01-22 ENCOUNTER — Other Ambulatory Visit (HOSPITAL_COMMUNITY): Payer: Self-pay

## 2024-01-23 ENCOUNTER — Encounter (HOSPITAL_BASED_OUTPATIENT_CLINIC_OR_DEPARTMENT_OTHER): Payer: Self-pay

## 2024-01-23 ENCOUNTER — Ambulatory Visit (HOSPITAL_BASED_OUTPATIENT_CLINIC_OR_DEPARTMENT_OTHER): Payer: Self-pay | Admitting: Family Medicine

## 2024-01-23 ENCOUNTER — Other Ambulatory Visit (HOSPITAL_COMMUNITY): Payer: Self-pay

## 2024-01-23 ENCOUNTER — Ambulatory Visit (HOSPITAL_BASED_OUTPATIENT_CLINIC_OR_DEPARTMENT_OTHER)
Admission: RE | Admit: 2024-01-23 | Discharge: 2024-01-23 | Disposition: A | Discharge status: Home / Self Care | Source: Ambulatory Visit | Attending: Family Medicine | Admitting: Family Medicine

## 2024-01-23 ENCOUNTER — Ambulatory Visit (INDEPENDENT_AMBULATORY_CARE_PROVIDER_SITE_OTHER): Admit: 2024-01-23 | Discharge: 2024-01-23 | Disposition: A | Discharge status: Home / Self Care | Admitting: Radiology

## 2024-01-23 VITALS — BP 135/88 | HR 83 | Temp 99.0°F | Resp 18

## 2024-01-23 DIAGNOSIS — R509 Fever, unspecified: Secondary | ICD-10-CM | POA: Diagnosis not present

## 2024-01-23 DIAGNOSIS — R051 Acute cough: Secondary | ICD-10-CM

## 2024-01-23 DIAGNOSIS — R059 Cough, unspecified: Secondary | ICD-10-CM | POA: Diagnosis not present

## 2024-01-23 DIAGNOSIS — J208 Acute bronchitis due to other specified organisms: Secondary | ICD-10-CM | POA: Diagnosis not present

## 2024-01-23 LAB — POCT INFLUENZA A/B
Influenza A, POC: NEGATIVE
Influenza B, POC: NEGATIVE

## 2024-01-23 MED ORDER — COMPACT SPACE CHAMBER DEVI: 0 refills | Status: AC

## 2024-01-23 MED ORDER — METHYLPREDNISOLONE ACETATE 80 MG/ML IJ SUSP
80.0000 mg | Freq: Once | INTRAMUSCULAR | Status: AC
  Administered 2024-01-23: 80 mg via INTRAMUSCULAR

## 2024-01-23 MED ORDER — PROMETHAZINE-DM 6.25-15 MG/5ML PO SYRP: 5.0000 mL | ORAL_SOLUTION | Freq: Four times a day (QID) | ORAL | 0 refills | Status: AC | PRN

## 2024-01-23 MED ORDER — ALBUTEROL SULFATE HFA 108 (90 BASE) MCG/ACT IN AERS: 2.0000 | INHALATION_SPRAY | RESPIRATORY_TRACT | 0 refills | Status: AC | PRN

## 2024-01-23 NOTE — Discharge Instructions (Addendum)
 Acute viral bronchitis with cough and low-grade fever: Patient is negative for influenza type A and type B.  Symptoms have persisted for 4 to 5 days.  Chest x-ray is hazy but does not show consolidation.  Basically the chest x-ray shows some indications for bronchitis but not pneumonia.  Albuterol  inhaler with spacer, 2 puffs, every 4 hours if needed for wheezing.  Promethazine  DM, 5 mL, every 6 hours if needed for cough.  Depo-Medrol  80 mg injection now.  Follow-up if symptoms do not improve, worsen or new symptoms occur.

## 2024-01-23 NOTE — Progress Notes (Signed)
 Chest x-ray is negative, no pneumonia.  Patient was given that report during the visit.  No change in plan.  He does have a viral bronchitis.  Follow-up here as needed.  Voicemail message left for patient with the above information.

## 2024-01-23 NOTE — ED Triage Notes (Signed)
 Pt c/o sore throat headache body aches nasal congestion fever started last Wednesday.Denies ear pain. Has taken Tylenol 

## 2024-01-23 NOTE — ED Provider Notes (Signed)
 [facility]    CSN: [id number] Arrival date & time: 01/23/24  9066      History   Chief Complaint Chief Complaint  Patient presents with   Cough    Fever chills with cough and congestion! Sore throat - Entered by patient    HPI Alexander James is a 43 y.o. male.   43 year old male with report of sore throat, cough, headache, body aches, nasal congestion and fever.  Symptoms started on Wednesday, 01/19/2024.  He and the family were out in [facility] for 2 weeks for rodeo national tournament.  He has taken acetaminophen  for the fever.  His daughter is positive for influenza type A but her symptoms started after his.   Cough Associated symptoms: fever, headaches and rhinorrhea   Associated symptoms: no chest pain, no chills, no ear pain, no rash and no sore throat     Past Medical History:  Diagnosis Date   Arrhythmia    Afib   Essential hypertension 06/09/2019   Family hx of colon cancer    father   GERD (gastroesophageal reflux disease)    Hyperlipidemia    On meds   Morbid obesity (HCC)    Comorbidities:  HTN, PAF   OSA (obstructive sleep apnea)    Paroxysmal A-fib (HCC) 06/09/2019   f/by [facility]   Skin lesion    f/by dermatology    Patient Active Problem List   Diagnosis Date Noted   Morbid obesity (HCC) 10/14/2023   Anxiety 07/12/2023   OSA on CPAP 07/12/2023   Asymptomatic hypertensive urgency 07/12/2023   Episode of apnea 05/11/2023   Alcohol use 05/07/2023   Hyperlipidemia 05/07/2023   Special screening for malignant neoplasms, colon    Right upper quadrant abdominal pain 04/26/2020   BMI 36.0-36.9,adult 04/26/2020   Essential hypertension 06/09/2019   Paroxysmal atrial fibrillation (HCC) 06/09/2019   Esophageal reflux 06/06/2013   Family history of malignant neoplasm of gastrointestinal tract 06/06/2013    Past Surgical History:  Procedure Laterality Date   ATRIAL FIBRILLATION ABLATION N/A 11/09/2019   Procedure: ATRIAL  FIBRILLATION ABLATION;  Surgeon: Kelsie Agent, MD;  Location: [facility];  Service: Cardiovascular;  Laterality: N/A;   COLONOSCOPY N/A 05/23/2021   Procedure: COLONOSCOPY;  Surgeon: Jinny Carmine, MD;  Location: [facility];  Service: Endoscopy;  Laterality: N/A;   TONSILLECTOMY         Home Medications    Prior to Admission medications  Medication Sig Start Date End Date Taking? Authorizing Provider  albuterol  (VENTOLIN  HFA) 108 (90 Base) MCG/ACT inhaler Inhale 2 puffs into the lungs every 4 (four) hours as needed for wheezing or shortness of breath. 01/23/24  Yes Ival Domino, FNP  promethazine -dextromethorphan (PROMETHAZINE -DM) 6.25-15 MG/5ML syrup Take 5 mLs by mouth 4 (four) times daily as needed for cough. Do not use and drive - May make drowsy. 01/23/24  Yes Ival Domino, FNP  Spacer/Aero-Holding Chambers (COMPACT SPACE CHAMBER) DEVI Use with the albuterol  inhaler 01/23/24  Yes Ival Domino, FNP  diltiazem  (CARTIA  XT) 120 MG 24 hr capsule Take 1 capsule (120 mg total) by mouth daily. 11/22/23   Krasowski, Robert J, MD  escitalopram  (LEXAPRO ) 10 MG tablet Take 1 tablet (10 mg total) by mouth daily. 10/12/23   Dottie Norleen PHEBE PONCE, MD  esomeprazole  (NEXIUM ) 40 MG capsule Take 1 capsule (40 mg total) by mouth every morning. 03/29/23     lisinopril  (ZESTRIL ) 10 MG tablet Take 1 tablet (10 mg total) by mouth  daily. 11/22/23   Krasowski, Robert J, MD  Multiple Vitamins-Minerals (MULTIVITAMIN GUMMIES ADULT PO) Take 2 tablets by mouth daily.    [provider]  pravastatin  (PRAVACHOL ) 20 MG tablet Take 1 tablet (20 mg total) by mouth at bedtime. 11/22/23   Krasowski, Robert J, MD  tirzepatide  (ZEPBOUND ) 2.5 MG/0.5ML Pen Inject 2.5 mg into the skin once a week. 01/05/24   Parrett, Madelin RAMAN, NP    Family History Family History  Problem Relation Age of Onset   Hypertension Mother    Colon cancer Father 44    Social History Social History[1]   Allergies    Patient has no known allergies.   Review of Systems Review of Systems  Constitutional:  Positive for fever. Negative for chills.  HENT:  Positive for congestion, postnasal drip and rhinorrhea. Negative for ear pain and sore throat.   Eyes:  Negative for pain and visual disturbance.  Respiratory:  Positive for cough and chest tightness.   Cardiovascular:  Negative for chest pain and palpitations.  Gastrointestinal:  Negative for abdominal pain, constipation, diarrhea, nausea and vomiting.  Genitourinary:  Negative for dysuria and hematuria.  Musculoskeletal:  Positive for arthralgias. Negative for back pain.  Skin:  Negative for color change and rash.  Neurological:  Positive for headaches. Negative for seizures and syncope.  All other systems reviewed and are negative.    Physical Exam Triage Vital Signs ED Triage Vitals  Encounter Vitals Group     BP 01/23/24 1004 135/88     Girls Systolic BP Percentile --      Girls Diastolic BP Percentile --      Boys Systolic BP Percentile --      Boys Diastolic BP Percentile --      Pulse Rate 01/23/24 1004 83     Resp 01/23/24 1003 18     Temp 01/23/24 1004 99 F (37.2 C)     Temp Source 01/23/24 1003 Oral     SpO2 01/23/24 1004 92 %     Weight --      Height --      Head Circumference --      Peak Flow --      Pain Score 01/23/24 1002 8     Pain Loc --      Pain Education --      Exclude from Growth Chart --    No data found.  Updated Vital Signs BP 135/88 (BP Location: Right Arm)   Pulse 83   Temp 99 F (37.2 C) (Oral)   Resp 18   SpO2 92%   Visual Acuity Right Eye Distance:   Left Eye Distance:   Bilateral Distance:    Right Eye Near:   Left Eye Near:    Bilateral Near:     Physical Exam Vitals and nursing note reviewed.  Constitutional:      General: He is not in acute distress.    Appearance: He is well-developed. He is ill-appearing. He is not toxic-appearing or diaphoretic.  HENT:     Head:  Normocephalic and atraumatic.     Right Ear: Hearing, tympanic membrane, ear canal and external ear normal.     Left Ear: Hearing, tympanic membrane, ear canal and external ear normal.     Nose: Congestion and rhinorrhea present. Rhinorrhea is clear.     Right Sinus: No maxillary sinus tenderness or frontal sinus tenderness.     Left Sinus: No maxillary sinus tenderness or frontal sinus tenderness.  Mouth/Throat:     Lips: Pink.     Mouth: Mucous membranes are moist.     Pharynx: Uvula midline. No oropharyngeal exudate or posterior oropharyngeal erythema.     Tonsils: No tonsillar exudate.  Eyes:     Conjunctiva/sclera: Conjunctivae normal.     Pupils: Pupils are equal, round, and reactive to light.  Cardiovascular:     Rate and Rhythm: Normal rate and regular rhythm.     Heart sounds: S1 normal and S2 normal. No murmur heard. Pulmonary:     Effort: Pulmonary effort is normal. No respiratory distress.     Breath sounds: Examination of the right-upper field reveals wheezing. Examination of the left-upper field reveals wheezing. Examination of the right-lower field reveals decreased breath sounds. Examination of the left-lower field reveals decreased breath sounds. Decreased breath sounds and wheezing present. No rhonchi or rales.  Abdominal:     General: Bowel sounds are normal.     Palpations: Abdomen is soft.     Tenderness: There is no abdominal tenderness.  Musculoskeletal:        General: No swelling.     Cervical back: Neck supple.  Lymphadenopathy:     Head:     Right side of head: No submental, submandibular, tonsillar, preauricular or posterior auricular adenopathy.     Left side of head: No submental, submandibular, tonsillar, preauricular or posterior auricular adenopathy.     Cervical: Cervical adenopathy present.     Right cervical: Superficial cervical adenopathy present.     Left cervical: Superficial cervical adenopathy present.  Skin:    General: Skin is warm and  dry.     Capillary Refill: Capillary refill takes less than 2 seconds.     Findings: No rash.  Neurological:     Mental Status: He is alert and oriented to person, place, and time.  Psychiatric:        Mood and Affect: Mood normal.      UC Treatments / Results  Labs (all labs ordered are listed, but only abnormal results are displayed) Labs Reviewed  POCT INFLUENZA A/B - Normal    EKG   Radiology No results found.  Procedures Procedures (including critical care time)  Medications Ordered in UC Medications  methylPREDNISolone  acetate (DEPO-MEDROL ) injection 80 mg (80 mg Intramuscular Given 01/23/24 1111)    Initial Impression / Assessment and Plan / UC Course  I have reviewed the triage vital signs and the nursing notes.  Pertinent labs & imaging results that were available during my care of the patient were reviewed by me and considered in my medical decision making (see chart for details).  Plan of Care (see discharge instructions for additional patient precautions and education): Acute viral bronchitis with cough and low-grade fever:  Patient is negative for influenza type A and type B.  Symptoms have persisted for 4 to 5 days.  I do not believe he has the flu.  He has an unidentified viral illness.   Chest x-ray is hazy but does not show consolidation.  Basically the chest x-ray shows some indications for bronchitis but not pneumonia. Albuterol  inhaler with spacer, 2 puffs, every 4 hours if needed for wheezing.   Promethazine  DM, 5 mL, every 6 hours if needed for cough.   Depo-Medrol  80 mg injection now. Follow-up if symptoms do not improve, worsen or new symptoms occur.  I reviewed the plan of care with the patient and/or the patient's guardian.  The patient and/or guardian had time to ask questions  and acknowledged that the questions were answered.  Final Clinical Impressions(s) / UC Diagnoses   Final diagnoses:  Acute cough  Acute viral bronchitis  Fever,  unspecified     Discharge Instructions      Acute viral bronchitis with cough and low-grade fever: Patient is negative for influenza type A and type B.  Symptoms have persisted for 4 to 5 days.  Chest x-ray is hazy but does not show consolidation.  Basically the chest x-ray shows some indications for bronchitis but not pneumonia.  Albuterol  inhaler with spacer, 2 puffs, every 4 hours if needed for wheezing.  Promethazine  DM, 5 mL, every 6 hours if needed for cough.  Depo-Medrol  80 mg injection now.  Follow-up if symptoms do not improve, worsen or new symptoms occur.     ED Prescriptions     Medication Sig Dispense Auth. Provider   albuterol  (VENTOLIN  HFA) 108 (90 Base) MCG/ACT inhaler Inhale 2 puffs into the lungs every 4 (four) hours as needed for wheezing or shortness of breath. 1 each Ival Domino, FNP   Spacer/Aero-Holding Chambers (COMPACT SPACE CHAMBER) DEVI Use with the albuterol  inhaler 1 each Ival Domino, FNP   promethazine -dextromethorphan (PROMETHAZINE -DM) 6.25-15 MG/5ML syrup Take 5 mLs by mouth 4 (four) times daily as needed for cough. Do not use and drive - May make drowsy. 118 mL Ival Domino, FNP      PDMP not reviewed this encounter.    [1]  Social History Tobacco Use   Smoking status: Never    Passive exposure: Never   Smokeless tobacco: Never  Vaping Use   Vaping status: Never Used  Substance Use Topics   Alcohol use: Not Currently    Comment: usually drinks 3-4 beers a day   Drug use: No     Ival Domino, FNP 01/23/24 1115

## 2024-01-24 ENCOUNTER — Other Ambulatory Visit: Payer: Self-pay

## 2024-01-24 ENCOUNTER — Other Ambulatory Visit (HOSPITAL_COMMUNITY): Payer: Self-pay

## 2024-01-25 ENCOUNTER — Other Ambulatory Visit (HOSPITAL_COMMUNITY): Payer: Self-pay

## 2024-01-27 ENCOUNTER — Telehealth: Payer: Self-pay

## 2024-01-27 NOTE — Telephone Encounter (Signed)
 Can we check with the pharmacy did not seem to see what the denial was for his Zepbound 

## 2024-01-27 NOTE — Telephone Encounter (Signed)
 Please advise Mychart messages.

## 2024-01-30 ENCOUNTER — Encounter (HOSPITAL_BASED_OUTPATIENT_CLINIC_OR_DEPARTMENT_OTHER): Payer: Self-pay | Admitting: Family Medicine

## 2024-01-31 DIAGNOSIS — G4733 Obstructive sleep apnea (adult) (pediatric): Secondary | ICD-10-CM | POA: Diagnosis not present

## 2024-01-31 DIAGNOSIS — S83241A Other tear of medial meniscus, current injury, right knee, initial encounter: Secondary | ICD-10-CM | POA: Diagnosis not present

## 2024-01-31 DIAGNOSIS — M25561 Pain in right knee: Secondary | ICD-10-CM | POA: Diagnosis not present

## 2024-02-01 ENCOUNTER — Telehealth (HOSPITAL_BASED_OUTPATIENT_CLINIC_OR_DEPARTMENT_OTHER): Payer: Self-pay

## 2024-02-01 NOTE — Telephone Encounter (Signed)
 Called patient and offered him an appointment 02/01/24 with another provider and he declined stating he was starting to feel better and if he was still sick next week he will call and schedule an appointment.

## 2024-02-15 ENCOUNTER — Other Ambulatory Visit (HOSPITAL_COMMUNITY): Payer: Self-pay

## 2024-03-07 ENCOUNTER — Other Ambulatory Visit (HOSPITAL_COMMUNITY): Payer: Self-pay

## 2024-03-14 ENCOUNTER — Other Ambulatory Visit (HOSPITAL_COMMUNITY): Payer: Self-pay

## 2024-03-15 ENCOUNTER — Encounter (HOSPITAL_BASED_OUTPATIENT_CLINIC_OR_DEPARTMENT_OTHER): Payer: Self-pay | Admitting: Family Medicine

## 2024-03-15 ENCOUNTER — Other Ambulatory Visit (HOSPITAL_COMMUNITY): Payer: Self-pay

## 2024-03-16 ENCOUNTER — Other Ambulatory Visit (HOSPITAL_COMMUNITY): Payer: Self-pay

## 2024-03-16 ENCOUNTER — Other Ambulatory Visit (HOSPITAL_BASED_OUTPATIENT_CLINIC_OR_DEPARTMENT_OTHER): Payer: Self-pay | Admitting: Family Medicine

## 2024-03-16 DIAGNOSIS — K219 Gastro-esophageal reflux disease without esophagitis: Secondary | ICD-10-CM

## 2024-03-16 MED ORDER — ESOMEPRAZOLE MAGNESIUM 40 MG PO CPDR
40.0000 mg | DELAYED_RELEASE_CAPSULE | Freq: Every morning | ORAL | 2 refills | Status: AC
  Filled 2024-03-16: qty 90, 90d supply, fill #0

## 2024-03-17 ENCOUNTER — Other Ambulatory Visit: Payer: Self-pay

## 2024-05-05 ENCOUNTER — Encounter (HOSPITAL_BASED_OUTPATIENT_CLINIC_OR_DEPARTMENT_OTHER): Admitting: Family Medicine

## 2024-10-03 ENCOUNTER — Ambulatory Visit: Admitting: Dermatology
